# Patient Record
Sex: Female | Born: 1935 | Race: White | Hispanic: No | Smoking: Never smoker
Health system: Southern US, Community
[De-identification: ages and names within clinical notes are randomized; demographics above are authoritative.]

## PROBLEM LIST (undated history)

## (undated) DIAGNOSIS — F32A Depression, unspecified: Secondary | ICD-10-CM

## (undated) DIAGNOSIS — C189 Malignant neoplasm of colon, unspecified: Secondary | ICD-10-CM

## (undated) DIAGNOSIS — I471 Supraventricular tachycardia, unspecified: Secondary | ICD-10-CM

## (undated) DIAGNOSIS — R112 Nausea with vomiting, unspecified: Secondary | ICD-10-CM

## (undated) DIAGNOSIS — M1990 Unspecified osteoarthritis, unspecified site: Secondary | ICD-10-CM

## (undated) DIAGNOSIS — H1851 Endothelial corneal dystrophy: Secondary | ICD-10-CM

## (undated) DIAGNOSIS — M8580 Other specified disorders of bone density and structure, unspecified site: Secondary | ICD-10-CM

## (undated) DIAGNOSIS — F329 Major depressive disorder, single episode, unspecified: Secondary | ICD-10-CM

## (undated) DIAGNOSIS — K4090 Unilateral inguinal hernia, without obstruction or gangrene, not specified as recurrent: Secondary | ICD-10-CM

## (undated) DIAGNOSIS — L509 Urticaria, unspecified: Secondary | ICD-10-CM

## (undated) DIAGNOSIS — Z9889 Other specified postprocedural states: Secondary | ICD-10-CM

## (undated) DIAGNOSIS — I480 Paroxysmal atrial fibrillation: Secondary | ICD-10-CM

## (undated) DIAGNOSIS — D693 Immune thrombocytopenic purpura: Secondary | ICD-10-CM

## (undated) HISTORY — PX: ROTATOR CUFF REPAIR: SHX139

## (undated) HISTORY — DX: Other specified disorders of bone density and structure, unspecified site: M85.80

## (undated) HISTORY — PX: COLON SURGERY: SHX602

## (undated) HISTORY — DX: Endothelial corneal dystrophy: H18.51

## (undated) HISTORY — DX: Supraventricular tachycardia: I47.1

## (undated) HISTORY — DX: Major depressive disorder, single episode, unspecified: F32.9

## (undated) HISTORY — DX: Supraventricular tachycardia, unspecified: I47.10

## (undated) HISTORY — PX: EYE SURGERY: SHX253

## (undated) HISTORY — PX: SMALL INTESTINE SURGERY: SHX150

## (undated) HISTORY — DX: Urticaria, unspecified: L50.9

## (undated) HISTORY — PX: ABDOMINAL HYSTERECTOMY: SHX81

## (undated) HISTORY — DX: Malignant neoplasm of colon, unspecified: C18.9

## (undated) HISTORY — DX: Immune thrombocytopenic purpura: D69.3

## (undated) HISTORY — DX: Unspecified osteoarthritis, unspecified site: M19.90

## (undated) HISTORY — DX: Depression, unspecified: F32.A

## (undated) HISTORY — DX: Paroxysmal atrial fibrillation: I48.0

---

## 1997-04-08 HISTORY — PX: SPLENECTOMY: SUR1306

## 1997-08-26 ENCOUNTER — Inpatient Hospital Stay (HOSPITAL_COMMUNITY): Admission: AD | Admit: 1997-08-26 | Discharge: 1997-09-19 | Payer: Self-pay | Admitting: Oncology

## 1998-01-19 ENCOUNTER — Ambulatory Visit (HOSPITAL_COMMUNITY): Admission: RE | Admit: 1998-01-19 | Discharge: 1998-01-19 | Payer: Self-pay | Admitting: Oncology

## 1998-04-17 ENCOUNTER — Encounter: Payer: Self-pay | Admitting: Orthopedic Surgery

## 1998-04-17 ENCOUNTER — Inpatient Hospital Stay (HOSPITAL_COMMUNITY): Admission: RE | Admit: 1998-04-17 | Discharge: 1998-04-19 | Payer: Self-pay | Admitting: Orthopedic Surgery

## 1998-05-01 ENCOUNTER — Encounter: Admission: RE | Admit: 1998-05-01 | Discharge: 1998-07-30 | Payer: Self-pay | Admitting: Orthopedic Surgery

## 2000-04-08 DIAGNOSIS — H18519 Endothelial corneal dystrophy, unspecified eye: Secondary | ICD-10-CM

## 2000-04-08 DIAGNOSIS — C189 Malignant neoplasm of colon, unspecified: Secondary | ICD-10-CM

## 2000-04-08 HISTORY — DX: Malignant neoplasm of colon, unspecified: C18.9

## 2000-04-08 HISTORY — DX: Endothelial corneal dystrophy, unspecified eye: H18.519

## 2000-11-10 ENCOUNTER — Encounter: Payer: Self-pay | Admitting: *Deleted

## 2000-11-10 ENCOUNTER — Encounter: Admission: RE | Admit: 2000-11-10 | Discharge: 2000-11-10 | Payer: Self-pay | Admitting: *Deleted

## 2000-12-03 ENCOUNTER — Ambulatory Visit (HOSPITAL_COMMUNITY): Admission: RE | Admit: 2000-12-03 | Discharge: 2000-12-03 | Payer: Self-pay | Admitting: Gastroenterology

## 2000-12-04 ENCOUNTER — Encounter: Payer: Self-pay | Admitting: Gastroenterology

## 2000-12-04 ENCOUNTER — Ambulatory Visit (HOSPITAL_COMMUNITY): Admission: RE | Admit: 2000-12-04 | Discharge: 2000-12-04 | Payer: Self-pay | Admitting: Gastroenterology

## 2000-12-22 ENCOUNTER — Encounter: Admission: RE | Admit: 2000-12-22 | Discharge: 2001-01-05 | Payer: Self-pay | Admitting: Surgery

## 2000-12-22 ENCOUNTER — Encounter: Payer: Self-pay | Admitting: Surgery

## 2000-12-29 ENCOUNTER — Inpatient Hospital Stay (HOSPITAL_COMMUNITY): Admission: RE | Admit: 2000-12-29 | Discharge: 2001-01-05 | Payer: Self-pay | Admitting: Surgery

## 2000-12-29 ENCOUNTER — Encounter (INDEPENDENT_AMBULATORY_CARE_PROVIDER_SITE_OTHER): Payer: Self-pay | Admitting: *Deleted

## 2001-01-14 ENCOUNTER — Encounter: Payer: Self-pay | Admitting: Surgery

## 2001-01-14 ENCOUNTER — Encounter: Admission: RE | Admit: 2001-01-14 | Discharge: 2001-01-14 | Payer: Self-pay | Admitting: Surgery

## 2001-02-02 ENCOUNTER — Encounter: Payer: Self-pay | Admitting: Surgery

## 2001-02-02 ENCOUNTER — Ambulatory Visit (HOSPITAL_COMMUNITY): Admission: RE | Admit: 2001-02-02 | Discharge: 2001-02-02 | Payer: Self-pay | Admitting: Surgery

## 2001-07-31 ENCOUNTER — Ambulatory Visit (HOSPITAL_BASED_OUTPATIENT_CLINIC_OR_DEPARTMENT_OTHER): Admission: RE | Admit: 2001-07-31 | Discharge: 2001-07-31 | Payer: Self-pay | Admitting: Surgery

## 2001-10-12 ENCOUNTER — Other Ambulatory Visit: Admission: RE | Admit: 2001-10-12 | Discharge: 2001-10-12 | Payer: Self-pay | Admitting: Geriatric Medicine

## 2002-01-06 ENCOUNTER — Ambulatory Visit (HOSPITAL_COMMUNITY): Admission: RE | Admit: 2002-01-06 | Discharge: 2002-01-06 | Payer: Self-pay | Admitting: Gastroenterology

## 2003-02-24 ENCOUNTER — Emergency Department (HOSPITAL_COMMUNITY): Admission: EM | Admit: 2003-02-24 | Discharge: 2003-02-24 | Payer: Self-pay | Admitting: *Deleted

## 2003-02-25 ENCOUNTER — Emergency Department (HOSPITAL_COMMUNITY): Admission: EM | Admit: 2003-02-25 | Discharge: 2003-02-25 | Payer: Self-pay

## 2003-11-16 ENCOUNTER — Encounter: Admission: RE | Admit: 2003-11-16 | Discharge: 2003-11-16 | Payer: Self-pay | Admitting: Geriatric Medicine

## 2004-05-24 ENCOUNTER — Ambulatory Visit: Payer: Self-pay | Admitting: Oncology

## 2005-05-21 ENCOUNTER — Ambulatory Visit: Payer: Self-pay | Admitting: Oncology

## 2005-08-07 ENCOUNTER — Encounter: Payer: Self-pay | Admitting: Oncology

## 2006-05-19 ENCOUNTER — Ambulatory Visit: Payer: Self-pay | Admitting: Oncology

## 2006-05-21 LAB — CBC WITH DIFFERENTIAL/PLATELET
Basophils Absolute: 0 10*3/uL (ref 0.0–0.1)
EOS%: 1.3 % (ref 0.0–7.0)
Eosinophils Absolute: 0.1 10*3/uL (ref 0.0–0.5)
HGB: 14 g/dL (ref 11.6–15.9)
MONO#: 0.5 10*3/uL (ref 0.1–0.9)
NEUT#: 3.8 10*3/uL (ref 1.5–6.5)
RDW: 13.2 % (ref 11.3–14.5)
WBC: 6.5 10*3/uL (ref 3.9–10.0)
lymph#: 2.1 10*3/uL (ref 0.9–3.3)

## 2006-05-21 LAB — COMPREHENSIVE METABOLIC PANEL
AST: 26 U/L (ref 0–37)
Albumin: 4.3 g/dL (ref 3.5–5.2)
BUN: 12 mg/dL (ref 6–23)
Calcium: 9.4 mg/dL (ref 8.4–10.5)
Chloride: 105 mEq/L (ref 96–112)
Glucose, Bld: 117 mg/dL — ABNORMAL HIGH (ref 70–99)
Potassium: 4.3 mEq/L (ref 3.5–5.3)

## 2007-05-27 ENCOUNTER — Ambulatory Visit: Payer: Self-pay | Admitting: Oncology

## 2010-08-24 NOTE — Consult Note (Signed)
[facility]  Patient:    Gabriella Jackson, Gabriella Jackson Visit Number: [id number] MRN: [id number]          Service Type: SUR Location: 4W [id number] 01 Attending Physician:  Andre Lefort Dictated by:   Genene Churn. Cyndie Chime, M.D. Proc. Date: 01/02/01 Admit Date:  12/29/2000   CC:         Sandria Bales. Ezzard Standing, M.D.  Hal T. Stoneking, M.D.  Charolett Bumpers III, M.D.   Consultation Report  HISTORY OF PRESENT ILLNESS:  This is a medical oncology consultation to evaluate this woman for recently diagnosed colon cancer.  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  Her disease was refractory to all medical treatment.  She came to a splenectomy in June 1999.  She had a slow but eventual complete response.  Last platelet count done in my office in July 2001, was 250,000.  She presented to her family physician with a three- to four-week history of vague right lower quadrant abdominal pain without any change in bowel habit, melena, or hematochezia.  She has had vague pains in the same area for a long time, but they always went away.  The difference this time was that the pain was constant.  A preliminary evaluation revealed a normal physical examination, a normal laboratory profile with hemoglobin of 14.3, normal liver chemistries, and a normal CEA of 2.8.  A CT scan of the abdomen and pelvis done on November 10, 2000, suggested a mass in the ascending colon.  This was confirmed by a colonoscopy done on December 04, 2000, which showed a large lobulated mass in the ascending colon extending to the cecal area.  She was admitted here on December 29, 2000, and underwent a right hemicolectomy by Dr. Ovidio Kin.  Liver was normal to inspection and palpation.  A large mass was palpable in the ascending colon.  There was no obvious lymphadenopathy. Pathology showed a 5 cm moderately differentiated adenocarcinoma  with penetration through the bowel wall into the pericolonic adipose tissue. Eighteen lymph nodes were negative for any tumor (T3, N0, M0).  She is having an uneventful recovery from surgery.  PHYSICAL EXAMINATION:  VITAL SIGNS:  Weight is 126 pounds, height 5 feet 4 inches.  SKIN, HAIR, NAILS:  Normal. HEENT:  Normal.  NECK:  Supple.  No thyromegaly.  LUNGS:  Clear.  Resonant to percussion.  CARDIOVASCULAR:  Regular cardiac rhythm.  No murmurs.  BREASTS:  No masses.  No lymphadenopathy.  ABDOMEN:  Soft.  Midline incision healing nicely.  No palpable hepatomegaly.  EXTREMITIES:  No edema.  No calf tenderness.  PERTINENT PREOPERATIVE LABORATORY DATA:  Hemoglobin 14.4, hematocrit 41.7, MCV 93, white count 7700, neutrophils 63%, platelets 329,000.  BUN 13, creatinine 0.7, SGOT 37, SGPT 21, alkaline phosphatase 86, bilirubin 0.5, LDH 164.  CEA 2.8.  IMPRESSION:  [age]-year-old woman with history of ITP in complete remission postsplenectomy, now presents with vague right lower quadrant abdominal pain and is found to have a lesion in the ascending colon. Pathology shows that this is a stage II, T3, N0, M0 lesion.  She would benefit from adjuvant chemotherapy.  This would increase her cure rate from approximately 70% to 85%.  I will offer her a standard program of 5-fluorouracil plus leucovorin after complete recovery from surgery.  She will consider her options, and I will arrange outpatient follow-up for a more detailed discussion with the patient and her family.  Thank you for this consultation. Dictated by:   Genene Churn. Cyndie Chime, M.D.  Attending Physician:  Andre Lefort DD:  01/02/01 TD:  01/03/01 Job: [id number]

## 2010-08-24 NOTE — Op Note (Signed)
   NAME:  Gabriella Jackson, Gabriella Jackson NO.:  [id number]   MEDICAL RECORD NO.:  [id number]                   PATIENT TYPE:  AMB   LOCATION:  ENDO                                 FACILITY:  [facility]   PHYSICIAN:  Danise Edge, M.D.                DATE OF BIRTH:  01-25-1925   DATE OF PROCEDURE:  01/06/2002  DATE OF DISCHARGE:                                 OPERATIVE REPORT   PROCEDURE:  Screening colonoscopy.   REFERRING PHYSICIAN:  Hal T. Pete Glatter, M.D.; Sandria Bales. Ezzard Standing, MD; Genene Churn.  Cyndie Chime, M.D.   PROCEDURE INDICATION:  The patient is a 77 year old female born September 23, 1924.  Approximately one year ago she was diagnosed with adenocarcinoma of  the ascending colon.  She underwent  surgery to remove the cancer and had postop chemotherapy.  She is scheduled  to undergo her first postoperative colorectal neoplasia screen.   ENDOSCOPIST:  Danise Edge, M.D.   PREMEDICATION:  Versed 5 mg, Demerol 50 mg.   ENDOSCOPE:  Olympus pediatric colonoscope.   PROCEDURE:  After obtaining informed consent, the patient was placed in the  left lateral decubitus position.  I administered intravenous Demerol and  intravenous Versed to achieve conscious sedation for the procedure.  The  patient's blood pressure, oxygen saturation, and cardiac rhythm were  monitored throughout the procedure and documented in the medical record.   Anal inspection was normal.  Digital rectal examination was normal.  The  Olympus pediatric video colonoscope was introduced into the rectum and  easily advanced to the ileo right colonic surgical anastomosis.  Colonic  preparation for the exam today was excellent.   Rectum:  Normal.   Sigmoid colon and descending colon:  Left colonic diverticulosis.   Splenic flexure:  Normal.   Transverse colon:  Normal.   Ileo right colonic surgical anastomosis:  Normal.   ASSESSMENT:  Normal screening proctocolonoscopy to the ileo right colonic  surgical anastomosis.  No endoscopic for the presence of recurrent  colorectal neoplasia.   RECOMMENDATIONS:  Repeat colonoscopy in three years.                                                   Danise Edge, M.D.    MJ/MEDQ  D:  01/06/2002  T:  01/06/2002  Job:  [id number]   cc:   Sandria Bales. Ezzard Standing, MD  [address]  [place]  [place] [address]  Fax: [phone]   Hal T. Stoneking, M.D.  [address]  [place], [place] [address]  Fax: [phone]   Genene Churn. Cyndie Chime, M.D.  [address] [facility]  [place]  [place] [address]  Fax: [phone]

## 2010-08-24 NOTE — Discharge Summary (Signed)
[facility]  Patient:    Gabriella Jackson, Gabriella Jackson Visit Number: [id number] MRN: [id number]          Service Type: DSU Location: DAY Attending Physician:  Andre Lefort Dictated by:   Sandria Bales. Ezzard Standing, M.D. Admit Date:  02/02/2001 Disc. Date: 01/05/01   CC:         Hal T. Stoneking, M.D.             Genene Churn. Cyndie Chime, M.D.                           Discharge Summary  DATE OF BIRTH:  Apr 01, 1925  CCS #[id number]  DISCHARGE DIAGNOSES: 1. Adenocarcinoma of the right colon extending through the muscularis propria    into pericolonic connective tissue with 0/18 nodes involved (T3, N0, M0    carcinoma). 2. Status post laparoscopic splenectomy for idiopathic thrombocytopenic    purpura in June 1999. 3. Rash, right upper arm, etiology undetermined. 4. Persistent nausea at the time of discharge. 5. On Adolor study.  PROCEDURE:  Right hemicolectomy on December 29, 2000.  HISTORY OF PRESENT ILLNESS:  Gabriella Jackson is a 77 year old, white female who is a patient of Dr. Alice Rieger who developed some right lower quadrant abdominal pain.  Dr. Pete Glatter obtained the CT of her abdomen which revealed a circumferential soft tissue thickening of the ascending colon.  Dr. Reece Agar did colonoscopy, but was only able to get to the splenic flexure.  A barium enema then revealed a large lobulated mass in the median wall of the ascending colon consistent with possible primary neoplasm of the colon.  The patient came to the hospital for resection of this right colon.  PAST MEDICAL HISTORY:  Laparoscopic splenectomy by Dr. Ovidio Kin in June 1999, for ITP and has responded well to that with normal platelet counts.  Dr. Cyndie Chime was managing hematology at the time.  ALLERGIES:  CODEINE and SULFA.  MEDICATION:  Premarin, which she had recently stopped on her own.  REVIEW OF SYSTEMS:  Otherwise unremarkable.  PHYSICAL EXAMINATION:  VITAL SIGNS:   Weight 125 pounds.  GENERAL:  She is a well-nourished, pleasant, older female, alert and cooperative on physical exam.  ABDOMEN:  Unremarkable, although I thought I could feel a mass in the right lower quadrant which may be 3-4 cm in size and is probably because of her slight size.  HOSPITAL COURSE:  She completed an antibiotic and mechanical bowel prep at home and presented to the hospital on December 29, 2000.  She then underwent a right hemicolectomy.  Also labeled a proximal, right colonic mesenteric node in a separate specimen.  Postoperatively, she did well.  She was placed on her Adolor study for bowel motility.  Her preop CEA was 2.8 with a normal range of 0 to 5.0.  Her postop hemoglobin was 1.18, hematocrit 34.5 and white count 10,200.  She tolerated small liquids, but had some persistent nausea throughout her entire hospital course.  We tried Phenergan, Zofran, Reglan all with minimal improvement in her symptoms.  She also developed an isolated rash of her right upper arm.  Etiology was unclear, but this resolved spontaneously before her discharge.  By September 30, which was postop day #7, she was at least keeping liquids down, still had some heartburn, mild diarrhea, but appeared to be ready for discharge.  She still complained of this mild, sort of persistent nausea.  I did give her  some Phenergan to go home.  ACTIVITY:  No heavy lifting for at least four weeks.  FOLLOWUP:  She will see me back in 10-14 days and call earlier with any persistent problems.  CONDITION ON DISCHARGE:  Good.  DISCHARGE LABORATORY DATA AND X-RAY FINDINGS:  Her final pathology showed a colonic adenocarcinoma extending through the muscularis into the pericolonic connective tissue, a T3, N0 tumor.  She had a total of 18 lymph nodes and all were negative. Dictated by:   Sandria Bales. Ezzard Standing, M.D. Attending Physician:  Andre Lefort DD:  02/02/01 TD:  02/03/01 Job: [id number]

## 2010-08-24 NOTE — Procedures (Signed)
[facility]  Patient:    Gabriella Jackson, Gabriella Jackson Visit Number: [id number] MRN: [id number]          Service Type: Attending:  Verlin Grills, M.D. Proc. Date: 12/03/00   CC:         Hal T. Stoneking, M.D.   Procedure Report  PROCEDURE:  Colonoscopy to the hepatic flexure.  REFERRING PHYSICIAN:  Hal T. Stoneking, M.D.  PROCEDURE INDICATION:  Ms. Gabriella Jackson (date of birth 12/18/2024) is a 77 year old female with right lower quadrant abdominal pain and a palpable mass in the right lower quadrant of her abdomen.  A CT scan of the abdomen and pelvis revealed abnormal ascending colon wall thickening, rule out colonic adenocarcinoma.  I discussed with Gabriella Jackson the complications associated with colonoscopy and polypectomy including intestinal bleeding and intestinal perforation. Gabriella Jackson has signed the operative permit.  ENDOSCOPIST:  Verlin Grills, M.D.  PREMEDICATION:  Versed 6 mg, Demerol 60 mg.  ENDOSCOPE:  Olympus pediatric colonoscope.  DESCRIPTION OF PROCEDURE:  After obtaining informed consent, Gabriella Jackson was placed in the left lateral decubitus position.  I administered intravenous Versed and intravenous Demerol to achieve conscious sedation for the procedure.  The patients blood pressure, oxygen saturations, and cardiac rhythm were monitored throughout the procedure and documented in the medical record.  Anal inspection was normal.  Digital rectal exam was normal.  The Olympus pediatric video colonoscope was introduced into the rectum and advanced to the hepatic flexure.  Due to left colon loop formation, I was unable to examine the right colon.  Colonic preparation for the exam today was excellent.  RECTUM:  Normal.  SIGMOID COLON AND DESCENDING COLON:  Normal.  SPLENIC FLEXURE:  Normal.  TRANSVERSE COLON:  Normal.  HEPATIC FLEXURE:  Normal.  Ascending colon, cecum, and ileocecal valve were not  examined.  ASSESSMENT:  Normal proctocolonoscopy to the hepatic flexure.  Due to colonic loop formation, I was unable to examine the ascending colon, cecum, or ileocecal valve.  RECOMMENDATIONS:  I will schedule Gabriella Jackson for an air contrast barium enema at [facility], December 04, 2000. Attending:  Verlin Grills, M.D. DD:  12/03/00 TD:  12/03/00 Job: [id number]

## 2011-04-17 DIAGNOSIS — H18519 Endothelial corneal dystrophy, unspecified eye: Secondary | ICD-10-CM | POA: Insufficient documentation

## 2012-02-05 DIAGNOSIS — Z961 Presence of intraocular lens: Secondary | ICD-10-CM | POA: Insufficient documentation

## 2012-05-13 ENCOUNTER — Telehealth: Payer: Self-pay | Admitting: Radiology

## 2012-05-13 ENCOUNTER — Ambulatory Visit (INDEPENDENT_AMBULATORY_CARE_PROVIDER_SITE_OTHER): Payer: Medicare Other | Admitting: Emergency Medicine

## 2012-05-13 ENCOUNTER — Ambulatory Visit: Payer: Medicare Other

## 2012-05-13 VITALS — BP 133/65 | HR 108 | Temp 98.0°F | Resp 18 | Ht 64.0 in | Wt 124.0 lb

## 2012-05-13 DIAGNOSIS — S0003XA Contusion of scalp, initial encounter: Secondary | ICD-10-CM

## 2012-05-13 DIAGNOSIS — S82009A Unspecified fracture of unspecified patella, initial encounter for closed fracture: Secondary | ICD-10-CM

## 2012-05-13 DIAGNOSIS — S8000XA Contusion of unspecified knee, initial encounter: Secondary | ICD-10-CM

## 2012-05-13 DIAGNOSIS — S0083XA Contusion of other part of head, initial encounter: Secondary | ICD-10-CM

## 2012-05-13 DIAGNOSIS — S1093XA Contusion of unspecified part of neck, initial encounter: Secondary | ICD-10-CM

## 2012-05-13 NOTE — Patient Instructions (Signed)
Knee Pain The knee is the complex joint between your thigh and your lower leg. It is made up of bones, tendons, ligaments, and cartilage. The bones that make up the knee are:  The femur in the thigh.  The tibia and fibula in the lower leg.  The patella or kneecap riding in the groove on the lower femur. CAUSES  Knee pain is a common complaint with many causes. A few of these causes are:  Injury, such as:  A ruptured ligament or tendon injury.  Torn cartilage.  Medical conditions, such as:  Gout  Arthritis  Infections  Overuse, over training or overdoing a physical activity. Knee pain can be minor or severe. Knee pain can accompany debilitating injury. Minor knee problems often respond well to self-care measures or get well on their own. More serious injuries may need medical intervention or even surgery. SYMPTOMS The knee is complex. Symptoms of knee problems can vary widely. Some of the problems are:  Pain with movement and weight bearing.  Swelling and tenderness.  Buckling of the knee.  Inability to straighten or extend your knee.  Your knee locks and you cannot straighten it.  Warmth and redness with pain and fever.  Deformity or dislocation of the kneecap. DIAGNOSIS  Determining what is wrong may be very straight forward such as when there is an injury. It can also be challenging because of the complexity of the knee. Tests to make a diagnosis may include:  Your caregiver taking a history and doing a physical exam.  Routine X-rays can be used to rule out other problems. X-rays will not reveal a cartilage tear. Some injuries of the knee can be diagnosed by:  Arthroscopy a surgical technique by which a small video camera is inserted through tiny incisions on the sides of the knee. This procedure is used to examine and repair internal knee joint problems. Tiny instruments can be used during arthroscopy to repair the torn knee cartilage (meniscus).  Arthrography  is a radiology technique. A contrast liquid is directly injected into the knee joint. Internal structures of the knee joint then become visible on X-ray film.  An MRI scan is a non x-ray radiology procedure in which magnetic fields and a computer produce two- or three-dimensional images of the inside of the knee. Cartilage tears are often visible using an MRI scanner. MRI scans have largely replaced arthrography in diagnosing cartilage tears of the knee.  Blood work.  Examination of the fluid that helps to lubricate the knee joint (synovial fluid). This is done by taking a sample out using a needle and a syringe. TREATMENT The treatment of knee problems depends on the cause. Some of these treatments are:  Depending on the injury, proper casting, splinting, surgery or physical therapy care will be needed.  Give yourself adequate recovery time. Do not overuse your joints. If you begin to get sore during workout routines, back off. Slow down or do fewer repetitions.  For repetitive activities such as cycling or running, maintain your strength and nutrition.  Alternate muscle groups. For example if you are a weight lifter, work the upper body on one day and the lower body the next.  Either tight or weak muscles do not give the proper support for your knee. Tight or weak muscles do not absorb the stress placed on the knee joint. Keep the muscles surrounding the knee strong.  Take care of mechanical problems.  If you have flat feet, orthotics or special shoes may help.   See your caregiver if you need help.  Arch supports, sometimes with wedges on the inner or outer aspect of the heel, can help. These can shift pressure away from the side of the knee most bothered by osteoarthritis.  A brace called an "unloader" brace also may be used to help ease the pressure on the most arthritic side of the knee.  If your caregiver has prescribed crutches, braces, wraps or ice, use as directed. The acronym for  this is PRICE. This means protection, rest, ice, compression and elevation.  Nonsteroidal anti-inflammatory drugs (NSAID's), can help relieve pain. But if taken immediately after an injury, they may actually increase swelling. Take NSAID's with food in your stomach. Stop them if you develop stomach problems. Do not take these if you have a history of ulcers, stomach pain or bleeding from the bowel. Do not take without your caregiver's approval if you have problems with fluid retention, heart failure, or kidney problems.  For ongoing knee problems, physical therapy may be helpful.  Glucosamine and chondroitin are over-the-counter dietary supplements. Both may help relieve the pain of osteoarthritis in the knee. These medicines are different from the usual anti-inflammatory drugs. Glucosamine may decrease the rate of cartilage destruction.  Injections of a corticosteroid drug into your knee joint may help reduce the symptoms of an arthritis flare-up. They may provide pain relief that lasts a few months. You may have to wait a few months between injections. The injections do have a small increased risk of infection, water retention and elevated blood sugar levels.  Hyaluronic acid injected into damaged joints may ease pain and provide lubrication. These injections may work by reducing inflammation. A series of shots may give relief for as long as 6 months.  Topical painkillers. Applying certain ointments to your skin may help relieve the pain and stiffness of osteoarthritis. Ask your pharmacist for suggestions. Many over the-counter products are approved for temporary relief of arthritis pain.  In some countries, doctors often prescribe topical NSAID's for relief of chronic conditions such as arthritis and tendinitis. A review of treatment with NSAID creams found that they worked as well as oral medications but without the serious side effects. PREVENTION  Maintain a healthy weight. Extra pounds put  more strain on your joints.  Get strong, stay limber. Weak muscles are a common cause of knee injuries. Stretching is important. Include flexibility exercises in your workouts.  Be smart about exercise. If you have osteoarthritis, chronic knee pain or recurring injuries, you may need to change the way you exercise. This does not mean you have to stop being active. If your knees ache after jogging or playing basketball, consider switching to swimming, water aerobics or other low-impact activities, at least for a few days a week. Sometimes limiting high-impact activities will provide relief.  Make sure your shoes fit well. Choose footwear that is right for your sport.  Protect your knees. Use the proper gear for knee-sensitive activities. Use kneepads when playing volleyball or laying carpet. Buckle your seat belt every time you drive. Most shattered kneecaps occur in car accidents.  Rest when you are tired. SEEK MEDICAL CARE IF:  You have knee pain that is continual and does not seem to be getting better.  SEEK IMMEDIATE MEDICAL CARE IF:  Your knee joint feels hot to the touch and you have a high fever. MAKE SURE YOU:   Understand these instructions.  Will watch your condition.  Will get help right away if you are not   doing well or get worse. Document Released: 01/20/2007 Document Revised: 06/17/2011 Document Reviewed: 01/20/2007 Exit[employer] Patient Information 2013 [employer].  

## 2012-05-13 NOTE — Telephone Encounter (Signed)
Called patient to advise her patella has a small fracture. She is advised to limit flexion of this until she can see ortho, have put in referral for urgent Ortho. She has seen Dr Cleophas Dunker in the past. I called Del[facility] Harness and worked her in for an appt for tomorrow, Dr Cleophas Dunker can see her at 10 am.

## 2012-05-13 NOTE — Progress Notes (Signed)
[facility] [address], [place] [address] [phone]  Date:  05/13/2012   Name:  Gabriella Jackson   DOB:  Aug 28, 1924   MRN:  [id number]  PCP:  No primary provider on file.    Chief Complaint: Fall and Knee Pain   History of Present Illness:  Gabriella Jackson is a 79 y.o. very pleasant female patient who presents with the following:  This afternoon was going to the bank and tripped over the curb and fell, landing on her right knee and left cheek.  Broke her glasses. Definitely did not have LOC or dizziness but insists that she tripped and fell. No LOC, no other complaint of neuro or visual symptoms.   No neck or wrist pain.  Says that she cannot painlessly bear weight on her right knee.  There is no problem list on file for this patient.   Past Medical History  Diagnosis Date  . Cancer   . Cataract   . Anxiety   . Clotting disorder     Past Surgical History  Procedure Date  . Eye surgery   . Colon surgery   . Abdominal hysterectomy   . Small intestine surgery     History  Substance Use Topics  . Smoking status: Never Smoker   . Smokeless tobacco: Not on file  . Alcohol Use: 3.5 oz/week    7 drink(s) per week    History reviewed. No pertinent family history.  Allergies  Allergen Reactions  . Codeine Nausea Only  . Sulfa Antibiotics     Medication list has been reviewed and updated.  Current Outpatient Prescriptions on File Prior to Visit  Medication Sig Dispense Refill  . citalopram (CELEXA) 20 MG tablet Take 20 mg by mouth daily.      Marland Kitchen loratadine (CLARITIN) 10 MG tablet Take 10 mg by mouth daily.      . simvastatin (ZOCOR) 10 MG tablet Take 10 mg by mouth at bedtime.        Review of Systems:  As per HPI, otherwise negative.    Physical Examination: Filed Vitals:   05/13/12 1332  BP: 133/65  Pulse: 108  Temp: 98 F (36.7 C)  Resp: 18   Filed Vitals:   05/13/12 1332  Height: 5\' 4"  (1.626 m)  Weight: 124 lb  (56.246 kg)   Body mass index is 21.28 kg/(m^2). Ideal Body Weight: Weight in (lb) to have BMI = 25: 145.3   GEN: WDWN, NAD, Non-toxic, A & O x 3 HEENT: contusion left malar prominence, Normocephalic. Neck supple. No masses, No LAD.  No Battle sign Ears and Nose: No external deformity. CV: RRR, No M/G/R. No JVD. No thrill. No extra heart sounds. PULM: CTA B, no wheezes, crackles, rhonchi. No retractions. No resp. distress. No accessory muscle use. ABD: S, NT, ND, +BS. No rebound. No HSM. EXTR: No c/c/e.  Contusion right knee NEURO Normal gait.  PSYCH: Normally interactive. Conversant. Not depressed or anxious appearing.  Calm demeanor.    Assessment and Plan: Contusion knee, right Contusion cheek, left  Carmelina Dane, MD  [facility] reading (PRIMARY) by  Dr. Dareen Piano.  Right knee.  Negative other than DJD.  [facility] reading (PRIMARY) by  Dr. Dareen Piano.  Facial bones.  No fracture or effusion.

## 2013-01-27 ENCOUNTER — Ambulatory Visit (INDEPENDENT_AMBULATORY_CARE_PROVIDER_SITE_OTHER): Payer: Medicare Other | Admitting: Internal Medicine

## 2013-01-27 ENCOUNTER — Encounter: Payer: Self-pay | Admitting: Internal Medicine

## 2013-01-27 ENCOUNTER — Encounter: Payer: Self-pay | Admitting: Interventional Cardiology

## 2013-01-27 VITALS — BP 116/74 | HR 107 | Wt 118.6 lb

## 2013-01-27 DIAGNOSIS — I498 Other specified cardiac arrhythmias: Secondary | ICD-10-CM

## 2013-01-27 DIAGNOSIS — Z23 Encounter for immunization: Secondary | ICD-10-CM

## 2013-01-27 DIAGNOSIS — I471 Supraventricular tachycardia: Secondary | ICD-10-CM | POA: Insufficient documentation

## 2013-01-27 LAB — BASIC METABOLIC PANEL
Calcium: 9.3 mg/dL (ref 8.4–10.5)
GFR: 65.56 mL/min (ref >60.00)
Glucose, Bld: 100 mg/dL — ABNORMAL HIGH (ref 70–99)
Potassium: 4.2 mEq/L (ref 3.5–5.1)
Sodium: 139 mEq/L (ref 135–145)

## 2013-01-27 LAB — T4, FREE: Free T4: 1.12 ng/dL (ref 0.60–1.60)

## 2013-01-27 MED ORDER — FLECAINIDE ACETATE 50 MG PO TABS: 50.0000 mg | ORAL_TABLET | Freq: Two times a day (BID) | ORAL | Status: DC

## 2013-01-27 MED ORDER — DILTIAZEM HCL ER COATED BEADS 120 MG PO CP24: 120.0000 mg | ORAL_CAPSULE | Freq: Every day | ORAL | Status: DC

## 2013-01-27 NOTE — Patient Instructions (Signed)
Your physician recommends that you schedule a follow-up appointment with Dr Johney Frame in one week  Your physician has recommended you make the following change in your medication:  1) Start Diltiazem 120mg  daily 2) Start Flecainide 50mg  twice daily   Your physician has requested that you have an echocardiogram. Echocardiography is a painless test that uses sound waves to create images of your heart. It provides your doctor with information about the size and shape of your heart and how well your heart's chambers and valves are working. This procedure takes approximately one hour. There are no restrictions for this procedure.  Your physician recommends that you return for lab work in: BMP/TSH/T4

## 2013-01-27 NOTE — Progress Notes (Signed)
Primary Care Physician: Dr Pete Glatter Referring Physician: Dr Fanny Skates is a 79 y.o. female with a h/o newly discovered SVT who presents for EP consultation.  She reports having symptoms of tachypalpitations several years ago which she previously attributed to anxiety.  Over the past few months, she reports increasing palpitations.  These are abrupt in onset and offset and are associated with flushing and dizziness.  She reports having an episode of brief presyncope several years ago but not recently.  She reports that episodes seem worse with anxiety.  Episodes have increased in frequency and duration.  Most episodes typically last several seconds, however they are now more frequent.  She presented to Dr Lesia Sago office today and was found to have sustained SVT.   Today, she denies symptoms of chest pain, shortness of breath, orthopnea, PND, lower extremity edema, or neurologic sequela. The patient is tolerating medications without difficulties and is otherwise without complaint today.   Past Medical History  Diagnosis Date  . Colon cancer 2002    s/p colonic resection  . Fuchs' corneal dystrophy 2002  . ITP (idiopathic thrombocytopenic purpura)     s/p splenectomy  . SVT (supraventricular tachycardia)   . Urticaria   . Depressed   . DJD (degenerative joint disease)   . Osteopenia    Past Surgical History  Procedure Laterality Date  . Eye surgery    . Colon surgery    . Abdominal hysterectomy    . Small intestine surgery    . Splenectomy  1999    Current Outpatient Prescriptions  Medication Sig Dispense Refill  . citalopram (CELEXA) 20 MG tablet Take 20 mg by mouth daily.      Marland Kitchen glucosamine-chondroitin 500-400 MG tablet Take 1 tablet by mouth 3 (three) times daily.      Marland Kitchen loratadine (CLARITIN) 10 MG tablet Take 10 mg by mouth daily.      . Multiple Vitamins-Minerals (MULTIVITAMIN WITH MINERALS) tablet Take 1 tablet by mouth daily.      . simvastatin (ZOCOR)  10 MG tablet Take 10 mg by mouth at bedtime.       No current facility-administered medications for this visit.    Allergies  Allergen Reactions  . Codeine Nausea Only  . Sulfa Antibiotics     History   Social History  . Marital Status: Married    Spouse Name: N/A    Number of Children: N/A  . Years of Education: N/A   Occupational History  . Not on file.   Social History Main Topics  . Smoking status: Never Smoker   . Smokeless tobacco: Not on file  . Alcohol Use: 3.5 oz/week    7 drink(s) per week     Comment: small glass of wine each day  . Drug Use: No  . Sexual Activity: No   Other Topics Concern  . Not on file   Social History Narrative   Lives in [place] alone.   Retired Diplomatic Services operational officer          Family History  Problem Relation Age of Onset  . CAD      ROS- All systems are reviewed and negative except as per the HPI above  Physical Exam: Filed Vitals:   01/27/13 1434  BP: 116/74  Pulse: 107  Weight: 118 lb 9.6 oz (53.797 kg)    GEN- The patient is elderly but pleasant appearing, alert and oriented x 3 today.   Head- normocephalic, atraumatic Eyes-  Sclera clear, conjunctiva  pink Ears- hearing intact Oropharynx- clear Neck- supple, no JVP Lymph- no cervical lymphadenopathy Lungs- Clear to ausculation bilaterally, normal work of breathing Heart- Regular rate and rhythm, no murmurs, rubs or gallops, PMI not laterally displaced GI- soft, NT, ND, + BS Extremities- no clubbing, cyanosis, or edema MS- no significant deformity or atrophy Skin- no rash or lesion Psych- euthymic mood, full affect Neuro- strength and sensation are intact  EKG today reveals sinus rhythm with nonsustained atach ekg from Dr Lesia Sago office reveals sustained SVT at 147 bpm Records from Dr Lesia Sago office are reviewed  Assessment and Plan:  1. SVT The patient presents with symptomatic recurrent SVT.  The behavior in the office today on telemetry is suggestive of  an atrial tach though I cannot exclude a reentrant arrhythmia. I will start diltiazem 120mg  daily today and also flecainide 50mg  BID Echo, tfts, bmet are ordered Return in 1 week Consider gxt once rhythm is stable to evaluate arrhythmias/ ischemia while on flecainide

## 2013-01-29 ENCOUNTER — Encounter: Payer: Self-pay | Admitting: *Deleted

## 2013-02-01 ENCOUNTER — Ambulatory Visit (INDEPENDENT_AMBULATORY_CARE_PROVIDER_SITE_OTHER): Payer: Medicare Other | Admitting: Internal Medicine

## 2013-02-01 ENCOUNTER — Encounter: Payer: Self-pay | Admitting: Internal Medicine

## 2013-02-01 VITALS — BP 129/77 | HR 95 | Ht 63.0 in | Wt 120.8 lb

## 2013-02-01 DIAGNOSIS — I498 Other specified cardiac arrhythmias: Secondary | ICD-10-CM

## 2013-02-01 DIAGNOSIS — I471 Supraventricular tachycardia: Secondary | ICD-10-CM

## 2013-02-01 NOTE — Patient Instructions (Signed)
Your physician recommends that you schedule a follow-up appointment in: 6 weeks with Dr Johney Frame  Your physician has requested that you have a lexiscan myoview. For further information please visit [email or web address] Please follow instruction sheet, as given.

## 2013-02-01 NOTE — Progress Notes (Signed)
PCP:  Ginette Otto, MD  The patient presents today for routine electrophysiology followup.  Since last being seen in our clinic, the patient reports doing very well.  She has not had any further symptoms of SVT.  She does have occasional fatigue and SOB with exertion.  Today, she denies symptoms of palpitations, chest pain,  orthopnea, PND, lower extremity edema, dizziness, presyncope, syncope, or neurologic sequela.  The patient feels that she is tolerating medications without difficulties and is otherwise without complaint today.   Past Medical History  Diagnosis Date  . Colon cancer 2002    s/p colonic resection  . Fuchs' corneal dystrophy 2002  . ITP (idiopathic thrombocytopenic purpura)     s/p splenectomy  . SVT (supraventricular tachycardia)   . Urticaria   . Depressed   . DJD (degenerative joint disease)   . Osteopenia    Past Surgical History  Procedure Laterality Date  . Eye surgery    . Colon surgery    . Abdominal hysterectomy    . Small intestine surgery    . Splenectomy  1999    Current Outpatient Prescriptions  Medication Sig Dispense Refill  . citalopram (CELEXA) 20 MG tablet Take 20 mg by mouth daily.      Marland Kitchen diltiazem (CARDIZEM CD) 120 MG 24 hr capsule Take 1 capsule (120 mg total) by mouth daily.  90 capsule  3  . flecainide (TAMBOCOR) 50 MG tablet Take 1 tablet (50 mg total) by mouth 2 (two) times daily.  180 tablet  3  . glucosamine-chondroitin 500-400 MG tablet Take 1 tablet by mouth 3 (three) times daily.      Marland Kitchen loratadine (CLARITIN) 10 MG tablet Take 10 mg by mouth daily.      . Multiple Vitamins-Minerals (MULTIVITAMIN WITH MINERALS) tablet Take 1 tablet by mouth daily.       No current facility-administered medications for this visit.    Allergies  Allergen Reactions  . Codeine Nausea Only  . Sulfa Antibiotics     History   Social History  . Marital Status: Married    Spouse Name: N/A    Number of Children: N/A  . Years of  Education: N/A   Occupational History  . Not on file.   Social History Main Topics  . Smoking status: Never Smoker   . Smokeless tobacco: Not on file  . Alcohol Use: 3.5 oz/week    7 drink(s) per week     Comment: small glass of wine each day  . Drug Use: No  . Sexual Activity: No   Other Topics Concern  . Not on file   Social History Narrative   Lives in [place] alone.   Retired Diplomatic Services operational officer          Family History  Problem Relation Age of Onset  . CAD      Physical Exam: Filed Vitals:   02/01/13 1605  BP: 129/77  Pulse: 95  Height: 5\' 3"  (1.6 m)  Weight: 120 lb 12.8 oz (54.795 kg)    GEN- The patient is well appearing, alert and oriented x 3 today.   Head- normocephalic, atraumatic Eyes-  Sclera clear, conjunctiva pink Ears- hearing intact Oropharynx- clear Neck- supple, no JVP Lymph- no cervical lymphadenopathy Lungs- Clear to ausculation bilaterally, normal work of breathing Heart- Regular rate and rhythm, no murmurs, rubs or gallops, PMI not laterally displaced GI- soft, NT, ND, + BS Extremities- no clubbing, cyanosis, or edema  ekg today reveals sinus rhythm 90 bpm,  PR 224, Qtc 474, rsR'  Assessment and Plan:  1. SVT (likely atrial tachycardia) Improved with flecainide Continue current medicines Could increase diltiazem as needed however given 1st degree AV block I will try to avoid this for now  As she is on flecainide, I will proceed with lexiscan myoview (She does not feel that she can walk on a treadmill due to hip pain) Echo is also pending  I will see her again in 6 weeks

## 2013-02-11 ENCOUNTER — Ambulatory Visit (HOSPITAL_COMMUNITY): Payer: Medicare Other | Attending: Cardiology | Admitting: Radiology

## 2013-02-11 DIAGNOSIS — R000 Tachycardia, unspecified: Secondary | ICD-10-CM | POA: Insufficient documentation

## 2013-02-11 DIAGNOSIS — I471 Supraventricular tachycardia: Secondary | ICD-10-CM

## 2013-02-11 DIAGNOSIS — I079 Rheumatic tricuspid valve disease, unspecified: Secondary | ICD-10-CM | POA: Insufficient documentation

## 2013-02-11 DIAGNOSIS — R0602 Shortness of breath: Secondary | ICD-10-CM

## 2013-02-11 DIAGNOSIS — R0609 Other forms of dyspnea: Secondary | ICD-10-CM | POA: Insufficient documentation

## 2013-02-11 DIAGNOSIS — R0989 Other specified symptoms and signs involving the circulatory and respiratory systems: Secondary | ICD-10-CM | POA: Insufficient documentation

## 2013-02-11 NOTE — Progress Notes (Signed)
Echocardiogram performed.  

## 2013-02-22 ENCOUNTER — Encounter (HOSPITAL_COMMUNITY): Payer: Medicare Other

## 2013-02-25 ENCOUNTER — Ambulatory Visit (HOSPITAL_COMMUNITY): Payer: Medicare Other | Attending: Internal Medicine | Admitting: Radiology

## 2013-02-25 VITALS — BP 154/88 | HR 82 | Ht 63.0 in | Wt 120.0 lb

## 2013-02-25 DIAGNOSIS — Z8249 Family history of ischemic heart disease and other diseases of the circulatory system: Secondary | ICD-10-CM | POA: Insufficient documentation

## 2013-02-25 DIAGNOSIS — R5381 Other malaise: Secondary | ICD-10-CM | POA: Insufficient documentation

## 2013-02-25 DIAGNOSIS — I471 Supraventricular tachycardia: Secondary | ICD-10-CM

## 2013-02-25 DIAGNOSIS — I498 Other specified cardiac arrhythmias: Secondary | ICD-10-CM | POA: Insufficient documentation

## 2013-02-25 DIAGNOSIS — I440 Atrioventricular block, first degree: Secondary | ICD-10-CM | POA: Insufficient documentation

## 2013-02-25 DIAGNOSIS — R0602 Shortness of breath: Secondary | ICD-10-CM | POA: Insufficient documentation

## 2013-02-25 MED ORDER — REGADENOSON 0.4 MG/5ML IV SOLN
0.4000 mg | Freq: Once | INTRAVENOUS | Status: AC
  Administered 2013-02-25: 0.4 mg via INTRAVENOUS

## 2013-02-25 MED ORDER — TECHNETIUM TC 99M SESTAMIBI GENERIC - CARDIOLITE
33.0000 | Freq: Once | INTRAVENOUS | Status: AC | PRN
  Administered 2013-02-25: 33 via INTRAVENOUS

## 2013-02-25 MED ORDER — TECHNETIUM TC 99M SESTAMIBI GENERIC - CARDIOLITE
11.0000 | Freq: Once | INTRAVENOUS | Status: AC | PRN
  Administered 2013-02-25: 11 via INTRAVENOUS

## 2013-02-25 NOTE — Progress Notes (Signed)
[facility] SITE 3 NUCLEAR MED [address] [place], [place] [address] [phone]    Cardiology Nuclear Med Study  Gabriella Jackson is a 79 y.o. female     MRN : [id number]     DOB: 01-03-1925  Procedure Date: 02/25/2013  Nuclear Med Background Indication for Stress Test:  Evaluation for Ischemia History:  No known CAD, Echo 02/2013 EF 60%, 1st degree AV Block, SVT Cardiac Risk Factors: Family History - CAD  Symptoms:  Fatigue and SOB   Nuclear Pre-Procedure Caffeine/Decaff Intake:  None NPO After: 9:00pm   Lungs:  clear O2 Sat: 94% on room air. IV 0.9% NS with Angio Cath:  22g  IV Site: R Hand  IV Started by:  Cathlyn Parsons, RN  Chest Size (in):  34 Cup Size: B  Height: 5\' 3"  (1.6 m)  Weight:  120 lb (54.432 kg)  BMI:  Body mass index is 21.26 kg/(m^2). Tech Comments:  n/a    Nuclear Med Study 1 or 2 day study: 1 day  Stress Test Type:  Lexiscan  Reading MD: Charlton Haws, MD  Order Authorizing Provider:  Jarold Song  Resting Radionuclide: Technetium 41m Sestamibi  Resting Radionuclide Dose: 10.7 mCi   Stress Radionuclide:  Technetium 11m Sestamibi  Stress Radionuclide Dose: 32.8 mCi           Stress Protocol Rest HR: 82 Stress HR: 116  Rest BP: 154/88 Stress BP: 158/81  Exercise Time (min): n/a METS: n/a   Predicted Max HR: 134 bpm % Max HR: 86.57 bpm Rate Pressure Product: 21308   Dose of Adenosine (mg):  n/a Dose of Lexiscan: 0.4 mg  Dose of Atropine (mg): n/a Dose of Dobutamine: n/a mcg/kg/min (at max HR)  Stress Test Technologist: Nelson Chimes, BS-ES  Nuclear Technologist:  Domenic Polite, CNMT     Rest Procedure:  Myocardial perfusion imaging was performed at rest 45 minutes following the intravenous administration of Technetium 83m Sestamibi. Rest ECG: NSR - Normal EKG  Stress Procedure:  The patient received IV Lexiscan 0.4 mg over 15-seconds.  Technetium 21m Sestamibi injected at 30-seconds.  The patient marched in place for  two minutes due to treadmill not starting. Quantitative spect images were obtained after a 45 minute delay.  During the infusion of Lexiscan, patient stated she felt slight SOB.  This resolved in recovery.  Stress ECG: No significant change from baseline ECG  QPS Raw Data Images:  Normal; no motion artifact; normal heart/lung ratio. Stress Images:  Normal homogeneous uptake in all areas of the myocardium. Rest Images:  Normal homogeneous uptake in all areas of the myocardium. Subtraction (SDS):  Normal Transient Ischemic Dilatation (Normal <1.22):  0.99 Lung/Heart Ratio (Normal <0.45):  0.28  Quantitative Gated Spect Images QGS EDV:  92 ml QGS ESV:  47 ml  Impression Exercise Capacity:  Lexiscan with low level exercise. BP Response:  Normal blood pressure response. Clinical Symptoms:  No significant symptoms noted. ECG Impression:  No significant ST segment change suggestive of ischemia. Comparison with Prior Nuclear Study: No previous nuclear study performed  Overall Impression:  Low risk stress nuclear study Mild decrease in LV function on surface images Suggest echo or MRI correlation No ischemia or infarction.  LV Ejection Fraction: 49%.  LV Wall Motion:  SEE ABOVE  Charlton Haws

## 2013-03-08 ENCOUNTER — Telehealth: Payer: Self-pay | Admitting: Internal Medicine

## 2013-03-08 NOTE — Telephone Encounter (Signed)
Daughter aware of results of stress test

## 2013-03-08 NOTE — Telephone Encounter (Signed)
Follow Up   Daughter calling to retrieve results--- Pt can not hear to well// Please call.

## 2013-03-17 ENCOUNTER — Ambulatory Visit (INDEPENDENT_AMBULATORY_CARE_PROVIDER_SITE_OTHER): Payer: Medicare Other | Admitting: Internal Medicine

## 2013-03-17 ENCOUNTER — Encounter (INDEPENDENT_AMBULATORY_CARE_PROVIDER_SITE_OTHER): Payer: Self-pay

## 2013-03-17 ENCOUNTER — Encounter: Payer: Self-pay | Admitting: Internal Medicine

## 2013-03-17 VITALS — BP 143/83 | HR 85 | Ht 63.0 in | Wt 121.0 lb

## 2013-03-17 DIAGNOSIS — I471 Supraventricular tachycardia: Secondary | ICD-10-CM

## 2013-03-17 DIAGNOSIS — I498 Other specified cardiac arrhythmias: Secondary | ICD-10-CM

## 2013-03-17 NOTE — Progress Notes (Signed)
PCP:  Ginette Otto, MD  The patient presents today for routine electrophysiology followup.  Since last being seen in our clinic, the patient reports doing very well.  She has not had any further symptoms of SVT.  Today, she denies symptoms of palpitations, chest pain,  orthopnea, PND, lower extremity edema, dizziness, presyncope, syncope, or neurologic sequela.  The patient feels that she is tolerating medications without difficulties and is otherwise without complaint today.   Past Medical History  Diagnosis Date  . Colon cancer 2002    s/p colonic resection  . Fuchs' corneal dystrophy 2002  . ITP (idiopathic thrombocytopenic purpura)     s/p splenectomy  . SVT (supraventricular tachycardia)   . Urticaria   . Depressed   . DJD (degenerative joint disease)   . Osteopenia    Past Surgical History  Procedure Laterality Date  . Eye surgery    . Colon surgery    . Abdominal hysterectomy    . Small intestine surgery    . Splenectomy  1999    Current Outpatient Prescriptions  Medication Sig Dispense Refill  . citalopram (CELEXA) 20 MG tablet Take 20 mg by mouth daily.      Marland Kitchen diltiazem (CARDIZEM CD) 120 MG 24 hr capsule Take 1 capsule (120 mg total) by mouth daily.  90 capsule  3  . flecainide (TAMBOCOR) 50 MG tablet Take 1 tablet (50 mg total) by mouth 2 (two) times daily.  180 tablet  3  . glucosamine-chondroitin 500-400 MG tablet Take 2 tablets by mouth daily.       Marland Kitchen loratadine (CLARITIN) 10 MG tablet Take 10 mg by mouth daily.      . Multiple Vitamins-Minerals (MULTIVITAMIN WITH MINERALS) tablet Take 1 tablet by mouth daily.       No current facility-administered medications for this visit.    Allergies  Allergen Reactions  . Codeine Nausea Only  . Sulfa Antibiotics     History   Social History  . Marital Status: Married    Spouse Name: N/A    Number of Children: N/A  . Years of Education: N/A   Occupational History  . Not on file.   Social History  Main Topics  . Smoking status: Never Smoker   . Smokeless tobacco: Not on file  . Alcohol Use: 3.5 oz/week    7 drink(s) per week     Comment: small glass of wine each day  . Drug Use: No  . Sexual Activity: No   Other Topics Concern  . Not on file   Social History Narrative   Lives in [place] alone.   Retired Diplomatic Services operational officer          Family History  Problem Relation Age of Onset  . CAD      Physical Exam: Filed Vitals:   03/17/13 1421  BP: 143/83  Pulse: 85  Height: 5\' 3"  (1.6 m)  Weight: 121 lb (54.885 kg)    GEN- The patient is well appearing, alert and oriented x 3 today.   Head- normocephalic, atraumatic Eyes-  Sclera clear, conjunctiva pink Ears- hearing intact Oropharynx- clear Neck- supple, no JVP Lymph- no cervical lymphadenopathy Lungs- Clear to ausculation bilaterally, normal work of breathing Heart- Regular rate and rhythm, no murmurs, rubs or gallops, PMI not laterally displaced GI- soft, NT, ND, + BS Extremities- no clubbing, cyanosis, or edema  ekg today reveals sinus rhythm 85 bpm, PR 222, Qtc 459, rsR' Echo and stress test reviewed with patient today  Assessment and Plan:  1. SVT (likely atrial tachycardia) Controlled with flecainide Continue current medicines  Return in 6 months

## 2013-03-17 NOTE — Patient Instructions (Signed)
Your physician wants you to follow-up in: 6 months with Dr. Allred. You will receive a reminder letter in the mail two months in advance. If you don't receive a letter, please call our office to schedule the follow-up appointment.  

## 2013-09-15 ENCOUNTER — Encounter: Payer: Self-pay | Admitting: Internal Medicine

## 2013-09-15 ENCOUNTER — Ambulatory Visit (INDEPENDENT_AMBULATORY_CARE_PROVIDER_SITE_OTHER): Payer: Medicare HMO | Admitting: Internal Medicine

## 2013-09-15 VITALS — BP 131/76 | HR 83 | Ht 63.0 in | Wt 111.0 lb

## 2013-09-15 DIAGNOSIS — I498 Other specified cardiac arrhythmias: Secondary | ICD-10-CM

## 2013-09-15 DIAGNOSIS — I471 Supraventricular tachycardia: Secondary | ICD-10-CM

## 2013-09-15 NOTE — Patient Instructions (Signed)
Your physician wants you to follow-up in: 12 months with Dr Allred You will receive a reminder letter in the mail two months in advance. If you don't receive a letter, please call our office to schedule the follow-up appointment.  

## 2013-09-17 NOTE — Progress Notes (Signed)
PCP:  Mathews Argyle, MD  The patient presents today for routine electrophysiology followup.  Since last being seen in our clinic, the patient reports doing very well.  She has not had any further symptoms of SVT.  Today, she denies symptoms of palpitations, exertional chest pain, orthopnea, PND, lower extremity edema, dizziness, presyncope, syncope, or neurologic sequela.  She has rare fleeting chest pains at rest.  She declines stress testing.  The patient feels that she is tolerating medications without difficulties and is otherwise without complaint today.   Past Medical History  Diagnosis Date  . Colon cancer 2002    s/p colonic resection  . Fuchs' corneal dystrophy 2002  . ITP (idiopathic thrombocytopenic purpura)     s/p splenectomy  . SVT (supraventricular tachycardia)   . Urticaria   . Depressed   . DJD (degenerative joint disease)   . Osteopenia    Past Surgical History  Procedure Laterality Date  . Eye surgery    . Colon surgery    . Abdominal hysterectomy    . Small intestine surgery    . Splenectomy  1999    Current Outpatient Prescriptions  Medication Sig Dispense Refill  . citalopram (CELEXA) 20 MG tablet Take 20 mg by mouth daily.      Marland Kitchen diltiazem (CARDIZEM CD) 120 MG 24 hr capsule Take 1 capsule (120 mg total) by mouth daily.  90 capsule  3  . flecainide (TAMBOCOR) 50 MG tablet Take 1 tablet (50 mg total) by mouth 2 (two) times daily.  180 tablet  3  . glucosamine-chondroitin 500-400 MG tablet Take 2 tablets by mouth daily.       Marland Kitchen loratadine (CLARITIN) 10 MG tablet Take 10 mg by mouth daily.      . Multiple Vitamins-Minerals (MULTIVITAMIN WITH MINERALS) tablet Take 1 tablet by mouth daily.       No current facility-administered medications for this visit.    Allergies  Allergen Reactions  . Codeine Nausea Only  . Sulfa Antibiotics     History   Social History  . Marital Status: Married    Spouse Name: N/A    Number of Children: N/A  .  Years of Education: N/A   Occupational History  . Not on file.   Social History Main Topics  . Smoking status: Never Smoker   . Smokeless tobacco: Not on file  . Alcohol Use: 3.5 oz/week    7 drink(s) per week     Comment: small glass of wine each day  . Drug Use: No  . Sexual Activity: No   Other Topics Concern  . Not on file   Social History Narrative   Lives in [place] alone.   Retired Network engineer          Family History  Problem Relation Age of Onset  . CAD      Physical Exam: Filed Vitals:   09/15/13 1230  BP: 131/76  Pulse: 83  Height: 5\' 3"  (1.6 m)  Weight: 111 lb (50.349 kg)    GEN- The patient is well appearing, alert and oriented x 3 today.   Head- normocephalic, atraumatic Eyes-  Sclera clear, conjunctiva pink Ears- hearing intact Oropharynx- clear Neck- supple, no JVP Lymph- no cervical lymphadenopathy Lungs- Clear to ausculation bilaterally, normal work of breathing Heart- Regular rate and rhythm, no murmurs, rubs or gallops, PMI not laterally displaced GI- soft, NT, ND, + BS Extremities- no clubbing, cyanosis, or edema  ekg today reveals sinus rhythm 83 bpm,  PR 216, Qtc 460, rsR' Echo and stress test reviewed with patient today  Assessment and Plan:  1. SVT (likely atrial tachycardia) Controlled with flecainide Continue current medicines  Return in 12 months

## 2014-02-04 ENCOUNTER — Other Ambulatory Visit: Payer: Self-pay | Admitting: Internal Medicine

## 2014-02-25 ENCOUNTER — Ambulatory Visit (INDEPENDENT_AMBULATORY_CARE_PROVIDER_SITE_OTHER): Payer: Medicare HMO | Admitting: Family Medicine

## 2014-02-25 ENCOUNTER — Encounter: Payer: Self-pay | Admitting: Family Medicine

## 2014-02-25 ENCOUNTER — Ambulatory Visit (INDEPENDENT_AMBULATORY_CARE_PROVIDER_SITE_OTHER)
Admission: RE | Admit: 2014-02-25 | Discharge: 2014-02-25 | Disposition: A | Discharge status: Home / Self Care | Payer: Medicare HMO | Source: Ambulatory Visit | Attending: Family Medicine | Admitting: Family Medicine

## 2014-02-25 ENCOUNTER — Other Ambulatory Visit (INDEPENDENT_AMBULATORY_CARE_PROVIDER_SITE_OTHER): Payer: Medicare HMO

## 2014-02-25 VITALS — BP 128/72 | HR 95 | Wt 113.0 lb

## 2014-02-25 DIAGNOSIS — M1990 Unspecified osteoarthritis, unspecified site: Secondary | ICD-10-CM

## 2014-02-25 DIAGNOSIS — M1611 Unilateral primary osteoarthritis, right hip: Secondary | ICD-10-CM

## 2014-02-25 DIAGNOSIS — M25551 Pain in right hip: Secondary | ICD-10-CM

## 2014-02-25 NOTE — Assessment & Plan Note (Signed)
Patient does have right hip arthritis likely. I would like to get x-rays to further evaluate. I do think that there is a possibility that patient could respond to injection of the hip but I would like to try conservative therapy first. Depending on the amount of arthritis patient may also want to consider surgical replacement with how well patient is doing at this time. Patient has had difficulty doing formal physical therapy for other problems previously. We discussed over-the-counter medicines and dosing that I think will be beneficial. We also discussed icing regimen and given home exercise program. Patient was referred to formal physical therapy which I also think will be beneficial. Patient and will come back in 4 weeks for further evaluation. Continuing to have difficulty we may want to try an intra-articular injection. Patient was warned of potential side effects.

## 2014-02-25 NOTE — Progress Notes (Signed)
  [facility] [place], [place] [address] Phone: [phone] Subjective:    CC: right hip pain  [id number] Gabriella Jackson is a 80 y.o. female coming in with complaint of Right hip pain. Patient states that she's been diagnosed with severe arthritis of the right hip previously. Patient was told that she needed a surgical repair but decided not to have this done. Patient states that this was greater than 2 years ago.patient states since that she is slowly started having worsening pain in the groin that seems to radiate towards her buttocks. Denies any significant radiation down the leg but can feel that her leg fatigues with a lot of walking. Denies any fevers or chills but has been losing some weight. Patient describes the pain as a dull aching sensation with sharp pain sometimes. Denies any nighttime awakenings. Patient is wondering if there is anything else that can be done other than a hip replacement.patient rates the severity of pain a 7 out of 10.     Past medical history, social, surgical and family history all reviewed in electronic medical record.   Review of Systems: No headache, visual changes, nausea, vomiting, diarrhea, constipation, dizziness, abdominal pain, skin rash, fevers, chills, night sweats, weight loss, swollen lymph nodes, body aches, joint swelling, muscle aches, chest pain, shortness of breath, mood changes.   Objective Blood pressure 128/72, pulse 95, weight 113 lb (51.256 kg), SpO2 97 %.  General: No apparent distress alert and oriented x3 mood and affect normal, dressed appropriately.  HEENT: Pupils equal, extraocular movements intact  Respiratory: Patient's speak in full sentences and does not appear short of breath  Cardiovascular: No lower extremity edema, non tender, no erythema  Skin: Warm dry intact with no signs of infection or rash on extremities or on axial skeleton.  Abdomen: Soft nontender  Neuro: Cranial  nerves II through XII are intact, neurovascularly intact in all extremities with 2+ DTRs and 2+ pulses.  Lymph: No lymphadenopathy of posterior or anterior cervical chain or axillae bilaterally.  Gait normal with good balance and coordination.  MSK:  Non tender with full range of motion and good stability and symmetric strength and tone of shoulders, elbows, wrist,  knee and ankles bilaterally.  [id number] ROM IR: 19 Deg with moderate pain, ER: 45 Deg, Flexion: 120 Deg, Extension: 100 Deg, Abduction: 45 Deg, Adduction: 45 Deg Strength IR: 4/5, ER: 5/5, Flexion: 5/5, Extension: 5/5, Abduction: 3/5, Adduction: 5/5 Pelvic alignment unremarkable to inspection and palpation. Standing hip rotation and gait without trendelenburg sign / unsteadiness. Greater trochanter without tenderness to palpation. No tenderness over piriformis and greater trochanter. + FADIR  Mild pain over the right SI joint Contralateral hip is unremarkable. Patient's get up and go normal    Impression and Recommendations:     This case required medical decision making of moderate complexity.

## 2014-02-25 NOTE — Patient Instructions (Addendum)
Good to meet you Xrays today.  Ice 20 minutes 2 times daily. Usually after activity and before bed. Exercises 3 times a week.  Take tylenol 650 mg three times a day is the best evidence based medicine we have for arthritis.  Fish oil 2 grams daily.  Tumeric 500mg  daily.  Cortisone injections are an option if these interventions do not seem to make a difference or need more relief.  If cortisone injections do not help, there are different types of shots that may help but they take longer to take effect.  We can discuss this at follow up.  It's important that you continue to stay active. Controlling your weight is important.  Consider physical therapy to strengthen muscles around the joint that hurts to take pressure off of the joint itself. Water aerobics and cycling with low resistance are the best two types of exercise for arthritis. Come back and see me in 3 weeks.    Dr. Frederik Pear or Dr. Melrose Nakayama at [facility].

## 2014-03-10 ENCOUNTER — Ambulatory Visit: Payer: Medicare HMO | Attending: Family Medicine

## 2014-03-10 DIAGNOSIS — M2560 Stiffness of unspecified joint, not elsewhere classified: Secondary | ICD-10-CM | POA: Insufficient documentation

## 2014-03-10 DIAGNOSIS — M25551 Pain in right hip: Secondary | ICD-10-CM | POA: Diagnosis not present

## 2014-03-10 DIAGNOSIS — R5381 Other malaise: Secondary | ICD-10-CM | POA: Diagnosis not present

## 2014-03-16 ENCOUNTER — Ambulatory Visit: Payer: Medicare HMO | Admitting: Physical Therapy

## 2014-03-16 DIAGNOSIS — M25551 Pain in right hip: Secondary | ICD-10-CM | POA: Diagnosis not present

## 2014-03-18 ENCOUNTER — Other Ambulatory Visit (INDEPENDENT_AMBULATORY_CARE_PROVIDER_SITE_OTHER): Payer: Medicare HMO

## 2014-03-18 ENCOUNTER — Ambulatory Visit (INDEPENDENT_AMBULATORY_CARE_PROVIDER_SITE_OTHER): Payer: Medicare HMO | Admitting: Family Medicine

## 2014-03-18 ENCOUNTER — Encounter: Payer: Self-pay | Admitting: Family Medicine

## 2014-03-18 VITALS — BP 126/62 | HR 105 | Ht 63.0 in | Wt 114.0 lb

## 2014-03-18 DIAGNOSIS — M1611 Unilateral primary osteoarthritis, right hip: Secondary | ICD-10-CM

## 2014-03-18 DIAGNOSIS — M1990 Unspecified osteoarthritis, unspecified site: Secondary | ICD-10-CM

## 2014-03-18 NOTE — Assessment & Plan Note (Signed)
Patient was given an injection today and tolerated the procedure very well. We discussed icing regimen at home exercises. We discussed potential side effects to the hip injection into seek medical attention if any bleeding occurs or if any signs of infection. Patient will increase her activity as tolerated. We discussed again that surgical intervention likely will be patient's best option. Patient knows that this injection is only going to be temporary relief. Patient will try to remain active. Patient can follow-up with me again in 4 weeks to make sure she is improving.  Spent greater than 25 minutes with patient face-to-face and had greater than 50% of counseling including as described above in assessment and plan.

## 2014-03-18 NOTE — Progress Notes (Signed)
[facility] [address], [place] [address] Phone: [phone] Subjective:    CC: right hip pain follow up  [id number] Gabriella Gabriella DESROSIERS is a 80 y.o. female coming in with complaint of Right hip pain. Patient states that she's been diagnosed with severe arthritis of the right hip previously. Patient was told that she needed a surgical repair but decided not to have this done. I did see patient in patient is a moderate to severe osteophytic changes on x-rays back on 02/25/2014. Patient was given conservative therapy including home exercises, icing protocol, over-the-counter medications. We also discussed the possibility of surgical intervention. Patient states she did not make any improvement and actually states that the physical therapy may worsen it. Patient still is having more pain with ambulation. Patient states that she would like to wait till after the holidays before she would even have surgery. Patient did have a intra-articular injection in the hip multiple years ago that did give her about 2 months of relief. Patient is still able to do activities of daily living but is not able to do much more than that. Pain can wake her up at night.     Past medical history, social, surgical and family history all reviewed in electronic medical record.   Review of Systems: No headache, visual changes, nausea, vomiting, diarrhea, constipation, dizziness, abdominal pain, skin rash, fevers, chills, night sweats, weight loss, swollen lymph nodes, body aches, joint swelling, muscle aches, chest pain, shortness of breath, mood changes.   Objective Blood pressure 126/62, pulse 105, height 5\' 3"  (1.6 m), weight 114 lb (51.71 kg), SpO2 96 %.  General: No apparent distress alert and oriented x3 mood and affect normal, dressed appropriately.  HEENT: Pupils equal, extraocular movements intact  Respiratory: Patient's speak in full sentences and does not appear short of  breath  Cardiovascular: No lower extremity edema, non tender, no erythema  Skin: Warm dry intact with no signs of infection or rash on extremities or on axial skeleton.  Abdomen: Soft nontender  Neuro: Cranial nerves II through XII are intact, neurovascularly intact in all extremities with 2+ DTRs and 2+ pulses.  Lymph: No lymphadenopathy of posterior or anterior cervical chain or axillae bilaterally.  Gait normal with good balance and coordination.  MSK:  Non tender with full range of motion and good stability and symmetric strength and tone of shoulders, elbows, wrist,  knee and ankles bilaterally.  EPP:IRJJO ROM IR: 81 Deg with moderate pain, ER: 45 Deg, Flexion: 120 Deg, Extension: 100 Deg, Abduction: 45 Deg, Adduction: 45 Deg Strength IR: 4/5, ER: 5/5, Flexion: 5/5, Extension: 5/5, Abduction: 3/5, Adduction: 5/5 Pelvic alignment unremarkable to inspection and palpation. Standing hip rotation and gait without trendelenburg sign / unsteadiness. Greater trochanter without tenderness to palpation. No tenderness over piriformis and greater trochanter. + FADIR  Mild pain over the right SI joint Contralateral hip is unremarkable. Patient's get up and go normal   Procedure: Real-time Ultrasound Guided Injection of right hip Device: GE Logiq E  Ultrasound guided injection is preferred based studies that show increased duration, increased effect, greater accuracy, decreased procedural pain, increased response rate with ultrasound guided versus blind injection.  Verbal informed consent obtained.  Time-out conducted.  Noted no overlying erythema, induration, or other signs of local infection.  Skin prepped in a sterile fashion.  Local anesthesia: Topical Ethyl chloride.  With sterile technique and under real time ultrasound guidance:  Anterior capsule visualized, needle visualized going to  the head neck junction at the anterior capsule. Pictures taken. Patient did have injection of 3 cc of 1%  lidocaine, 3 cc of 0.5% Marcaine, and 1 cc of Kenalog 40 mg/dL. Completed without difficulty  Pain immediately resolved suggesting accurate placement of the medication.  Advised to call if fevers/chills, erythema, induration, drainage, or persistent bleeding.  Images permanently stored and available for review in the ultrasound unit.  Impression: Technically successful ultrasound guided injection.    Impression and Recommendations:     This case required medical decision making of moderate complexity.

## 2014-03-18 NOTE — Patient Instructions (Addendum)
It is good to see you Gabriella Jackson is your friend.  Conitnue the exercises. As long as they do not hurt Important to stay active.  Did injection again today and hopefully will help I do recommend surgery at some point See me again in 4 weeks.

## 2014-07-20 ENCOUNTER — Ambulatory Visit: Payer: Medicare HMO | Admitting: Family Medicine

## 2014-10-03 ENCOUNTER — Other Ambulatory Visit: Payer: Self-pay

## 2014-10-20 ENCOUNTER — Encounter (HOSPITAL_COMMUNITY): Payer: Self-pay

## 2014-10-26 ENCOUNTER — Encounter (HOSPITAL_COMMUNITY)
Admission: RE | Admit: 2014-10-26 | Discharge: 2014-10-26 | Disposition: A | Discharge status: Home / Self Care | Payer: Medicare Other | Source: Ambulatory Visit | Attending: Orthopedic Surgery | Admitting: Orthopedic Surgery

## 2014-10-26 ENCOUNTER — Encounter (HOSPITAL_COMMUNITY): Payer: Self-pay

## 2014-10-26 DIAGNOSIS — M1611 Unilateral primary osteoarthritis, right hip: Secondary | ICD-10-CM | POA: Diagnosis not present

## 2014-10-26 DIAGNOSIS — I440 Atrioventricular block, first degree: Secondary | ICD-10-CM | POA: Insufficient documentation

## 2014-10-26 DIAGNOSIS — Z0183 Encounter for blood typing: Secondary | ICD-10-CM | POA: Insufficient documentation

## 2014-10-26 DIAGNOSIS — Z01812 Encounter for preprocedural laboratory examination: Secondary | ICD-10-CM | POA: Insufficient documentation

## 2014-10-26 HISTORY — DX: Unilateral inguinal hernia, without obstruction or gangrene, not specified as recurrent: K40.90

## 2014-10-26 HISTORY — DX: Nausea with vomiting, unspecified: R11.2

## 2014-10-26 HISTORY — DX: Other specified postprocedural states: Z98.890

## 2014-10-26 LAB — CBC
HEMATOCRIT: 40.2 % (ref 36.0–46.0)
HEMOGLOBIN: 13.1 g/dL (ref 12.0–15.0)
MCH: 31.9 pg (ref 26.0–34.0)
MCHC: 32.6 g/dL (ref 30.0–36.0)
MCV: 97.8 fL (ref 78.0–100.0)
PLATELETS: 228 10*3/uL (ref 150–400)
RBC: 4.11 MIL/uL (ref 3.87–5.11)
RDW: 13.2 % (ref 11.5–15.5)
WBC: 6.3 10*3/uL (ref 4.0–10.5)

## 2014-10-26 LAB — BASIC METABOLIC PANEL
Anion gap: 11 (ref 5–15)
BUN: 17 mg/dL (ref 6–20)
CALCIUM: 9.3 mg/dL (ref 8.9–10.3)
CO2: 26 mmol/L (ref 22–32)
CREATININE: 0.67 mg/dL (ref 0.44–1.00)
Chloride: 102 mmol/L (ref 101–111)
GFR calc non Af Amer: >60 mL/min (ref >60)
Glucose, Bld: 96 mg/dL (ref 65–99)
Potassium: 3.9 mmol/L (ref 3.5–5.1)
Sodium: 139 mmol/L (ref 135–145)

## 2014-10-26 LAB — URINALYSIS, ROUTINE W REFLEX MICROSCOPIC
BILIRUBIN URINE: NEGATIVE
Glucose, UA: NEGATIVE mg/dL
Ketones, ur: NEGATIVE mg/dL
NITRITE: POSITIVE — AB
PROTEIN: NEGATIVE mg/dL
Specific Gravity, Urine: 1.017 (ref 1.005–1.030)
Urobilinogen, UA: 0.2 mg/dL (ref 0.0–1.0)
pH: 5.5 (ref 5.0–8.0)

## 2014-10-26 LAB — SURGICAL PCR SCREEN
MRSA, PCR: NEGATIVE
STAPHYLOCOCCUS AUREUS: NEGATIVE

## 2014-10-26 LAB — PROTIME-INR
INR: 0.95 (ref 0.00–1.49)
Prothrombin Time: 12.9 seconds (ref 11.6–15.2)

## 2014-10-26 LAB — URINE MICROSCOPIC-ADD ON

## 2014-10-26 LAB — APTT: APTT: 35 s (ref 24–37)

## 2014-10-26 LAB — ABO/RH: ABO/RH(D): O POS

## 2014-10-26 NOTE — Pre-Procedure Instructions (Signed)
Labs viewable in Epic- note urinalysis.

## 2014-10-26 NOTE — Patient Instructions (Addendum)
[place]  10/26/2014   Your procedure is scheduled on:   -11-01-2014 Tuesday  Enter through [facility]  Entrance and follow signs to [facility] Arrive at      0515  AM.  (Limit 1 person with you).  Call this number if you have problems the morning of surgery: [phone]  Or Presurgical Testing [phone].   For Living Will and/or Health Care Power Attorney Forms: please provide copy for your medical record,may bring AM of surgery(Forms should be already notarized -we do not provide this service).(10-26-14 Yes/ No information preferred today).     Do not eat food/ or drink: After Midnight.     Take these medicines the morning of surgery with A SIP OF WATER: Citalopram. Diltiazem. Flecainide. Loratadine.   Do not wear jewelry, make-up or nail polish.  Do not wear deodorant, lotions, powders, or perfumes.   Do not shave legs and under arms- 48 hours(2 days) prior to first CHG shower.(Shaving face and neck okay.)  Do not bring valuables to the hospital.(Hospital is not responsible for lost valuables).  Contacts, dentures or removable bridgework, body piercing, hair pins may not be worn into surgery.  Leave suitcase in the car. After surgery it may be brought to your room.  For patients admitted to the hospital, checkout time is 11:00 AM the day of discharge.(Restricted visitors-Any Persons displaying flu-like symptoms or illness).    Patients discharged the day of surgery will not be allowed to drive home. Must have responsible person with you x 24 hours once discharged.  Name and phone number of your driver: Daughter Deeann Saint - [phone]     Please read over the following fact sheets that you were given:  CHG(Chlorhexidine Gluconate 4% Surgical Soap) use, MRSA Information, Blood Transfusion fact sheet, Incentive Spirometry Instruction.  Remember : Type/Screen "Blue armbands" - may not be removed once applied(would result in being retested AM of  surgery, if removed).         [place] - Preparing for Surgery Before surgery, you can play an important role.  Because skin is not sterile, your skin needs to be as free of germs as possible.  You can reduce the number of germs on your skin by washing with CHG (chlorahexidine gluconate) soap before surgery.  CHG is an antiseptic cleaner which kills germs and bonds with the skin to continue killing germs even after washing. Please DO NOT use if you have an allergy to CHG or antibacterial soaps.  If your skin becomes reddened/irritated stop using the CHG and inform your nurse when you arrive at Short Stay. Do not shave (including legs and underarms) for at least 48 hours prior to the first CHG shower.  You may shave your face/neck. Please follow these instructions carefully:  1.  Shower with CHG Soap the night before surgery and the  morning of Surgery.  2.  If you choose to wash your hair, wash your hair first as usual with your  normal  shampoo.  3.  After you shampoo, rinse your hair and body thoroughly to remove the  shampoo.                           4.  Use CHG as you would any other liquid soap.  You can apply chg directly  to the skin and wash  Gently with a scrungie or clean washcloth.  5.  Apply the CHG Soap to your body ONLY FROM THE NECK DOWN.   Do not use on face/ open                           Wound or open sores. Avoid contact with eyes, ears mouth and genitals (private parts).                       Wash face,  Genitals (private parts) with your normal soap.             6.  Wash thoroughly, paying special attention to the area where your surgery  will be performed.  7.  Thoroughly rinse your body with warm water from the neck down.  8.  DO NOT shower/wash with your normal soap after using and rinsing off  the CHG Soap.                9.  Pat yourself dry with a clean towel.            10.  Wear clean pajamas.            11.  Place clean sheets on your bed  the night of your first shower and do not  sleep with pets. Day of Surgery : Do not apply any lotions/deodorants the morning of surgery.  Please wear clean clothes to the hospital/surgery center.  FAILURE TO FOLLOW THESE INSTRUCTIONS MAY RESULT IN THE CANCELLATION OF YOUR SURGERY PATIENT SIGNATURE_________________________________  NURSE SIGNATURE__________________________________  ________________________________________________________________________   Adam Phenix  An incentive spirometer is a tool that can help keep your lungs clear and active. This tool measures how well you are filling your lungs with each breath. Taking long deep breaths may help reverse or decrease the chance of developing breathing (pulmonary) problems (especially infection) following:  A long period of time when you are unable to move or be active. BEFORE THE PROCEDURE   If the spirometer includes an indicator to show your best effort, your nurse or respiratory therapist will set it to a desired goal.  If possible, sit up straight or lean slightly forward. Try not to slouch.  Hold the incentive spirometer in an upright position. INSTRUCTIONS FOR USE   Sit on the edge of your bed if possible, or sit up as far as you can in bed or on a chair.  Hold the incentive spirometer in an upright position.  Breathe out normally.  Place the mouthpiece in your mouth and seal your lips tightly around it.  Breathe in slowly and as deeply as possible, raising the piston or the ball toward the top of the column.  Hold your breath for 3-5 seconds or for as long as possible. Allow the piston or ball to fall to the bottom of the column.  Remove the mouthpiece from your mouth and breathe out normally.  Rest for a few seconds and repeat Steps 1 through 7 at least 10 times every 1-2 hours when you are awake. Take your time and take a few normal breaths between deep breaths.  The spirometer may include an indicator  to show your best effort. Use the indicator as a goal to work toward during each repetition.  After each set of 10 deep breaths, practice coughing to be sure your lungs are clear. If you have an incision (the cut made at the time of  surgery), support your incision when coughing by placing a pillow or rolled up towels firmly against it. Once you are able to get out of bed, walk around indoors and cough well. You may stop using the incentive spirometer when instructed by your caregiver.  RISKS AND COMPLICATIONS  Take your time so you do not get dizzy or light-headed.  If you are in pain, you may need to take or ask for pain medication before doing incentive spirometry. It is harder to take a deep breath if you are having pain. AFTER USE  Rest and breathe slowly and easily.  It can be helpful to keep track of a log of your progress. Your caregiver can provide you with a simple table to help with this. If you are using the spirometer at home, follow these instructions: [place] IF:   You are having difficultly using the spirometer.  You have trouble using the spirometer as often as instructed.  Your pain medication is not giving enough relief while using the spirometer.  You develop fever of 100.5 F (38.1 C) or higher. SEEK IMMEDIATE MEDICAL CARE IF:   You cough up bloody sputum that had not been present before.  You develop fever of 102 F (38.9 C) or greater.  You develop worsening pain at or near the incision site. MAKE SURE YOU:   Understand these instructions.  Will watch your condition.  Will get help right away if you are not doing well or get worse. Document Released: 08/05/2006 Document Revised: 06/17/2011 Document Reviewed: 10/06/2006 ExitCare Patient Information 2014 Exit[employer], [place].   ________________________________________________________________________  WHAT IS A BLOOD TRANSFUSION? Blood Transfusion Information  A transfusion is the replacement of  blood or some of its parts. Blood is made up of multiple cells which provide different functions.  Red blood cells carry oxygen and are used for blood loss replacement.  White blood cells fight against infection.  Platelets control bleeding.  Plasma helps clot blood.  Other blood products are available for specialized needs, such as hemophilia or other clotting disorders. BEFORE THE TRANSFUSION  Who gives blood for transfusions?   Healthy volunteers who are fully evaluated to make sure their blood is safe. This is blood bank blood. Transfusion therapy is the safest it has ever been in the practice of medicine. Before blood is taken from a donor, a complete history is taken to make sure that person has no history of diseases nor engages in risky social behavior (examples are intravenous drug use or sexual activity with multiple partners). The donor's travel history is screened to minimize risk of transmitting infections, such as malaria. The donated blood is tested for signs of infectious diseases, such as HIV and hepatitis. The blood is then tested to be sure it is compatible with you in order to minimize the chance of a transfusion reaction. If you or a relative donates blood, this is often done in anticipation of surgery and is not appropriate for emergency situations. It takes many days to process the donated blood. RISKS AND COMPLICATIONS Although transfusion therapy is very safe and saves many lives, the main dangers of transfusion include:   Getting an infectious disease.  Developing a transfusion reaction. This is an allergic reaction to something in the blood you were given. Every precaution is taken to prevent this. The decision to have a blood transfusion has been considered carefully by your caregiver before blood is given. Blood is not given unless the benefits outweigh the risks. AFTER THE TRANSFUSION  Right after receiving a blood transfusion, you will usually feel much better  and more energetic. This is especially true if your red blood cells have gotten low (anemic). The transfusion raises the level of the red blood cells which carry oxygen, and this usually causes an energy increase.  The nurse administering the transfusion will monitor you carefully for complications. HOME CARE INSTRUCTIONS  No special instructions are needed after a transfusion. You may find your energy is better. Speak with your caregiver about any limitations on activity for underlying diseases you may have. SEEK MEDICAL CARE IF:   Your condition is not improving after your transfusion.  You develop redness or irritation at the intravenous (IV) site. SEEK IMMEDIATE MEDICAL CARE IF:  Any of the following symptoms occur over the next 12 hours:  Shaking chills.  You have a temperature by mouth above 102 F (38.9 C), not controlled by medicine.  Chest, back, or muscle pain.  People around you feel you are not acting correctly or are confused.  Shortness of breath or difficulty breathing.  Dizziness and fainting.  You get a rash or develop hives.  You have a decrease in urine output.  Your urine turns a dark color or changes to pink, red, or brown. Any of the following symptoms occur over the next 10 days:  You have a temperature by mouth above 102 F (38.9 C), not controlled by medicine.  Shortness of breath.  Weakness after normal activity.  The white part of the eye turns yellow (jaundice).  You have a decrease in the amount of urine or are urinating less often.  Your urine turns a dark color or changes to pink, red, or brown. Document Released: 03/22/2000 Document Revised: 06/17/2011 Document Reviewed: 11/09/2007 [facility] Patient Information 2014 [place], [place].  _______________________________________________________________________

## 2014-10-26 NOTE — Progress Notes (Signed)
10-26-14 1540 Labs viewable in [place]. Note urinalysis- note sent to office [phone].

## 2014-10-28 NOTE — H&P (Signed)
TOTAL HIP ADMISSION H&P  Patient is admitted for right total hip arthroplasty.  Subjective:  Chief Complaint: Right hip primary OA / pain  HPI: Gabriella Jackson, 81 y.o. female, has a history of pain and functional disability in the right hip(s) due to arthritis and patient has failed non-surgical conservative treatments for greater than 12 weeks to include NSAID's and/or analgesics, corticosteriod injections, use of assistive devices and activity modification.  Onset of symptoms was gradual starting 4-5 years ago with gradually worsening course since that time.The patient noted no past surgery on the right hip(s).  Patient currently rates pain in the right hip at 9 out of 10 with activity. Patient has worsening of pain with activity and weight bearing, trendelenberg gait, pain that interfers with activities of daily living and pain with passive range of motion. Patient has evidence of periarticular osteophytes and joint space narrowing by imaging studies. This condition presents safety issues increasing the risk of falls.  There is no current active infection.  Risks, benefits and expectations were discussed with the patient.  Risks including but not limited to the risk of anesthesia, blood clots, nerve damage, blood vessel damage, failure of the prosthesis, infection and up to and including death.  Patient understand the risks, benefits and expectations and wishes to proceed with surgery.   PCP: Mathews Argyle, MD  D/C Plans:      SNF  [facility])  Post-op Meds:       No Rx given  Tranexamic Acid:      To be given - IV  Decadron:      Is to be given  FYI:     ?  ASA ( ITP)  ? Analgesic medication   Patient Active Problem List   Diagnosis Date Noted  . Arthritis of right hip 02/25/2014  . SVT (supraventricular tachycardia) 01/27/2013   Past Medical History  Diagnosis Date  . Fuchs' corneal dystrophy 2002  . ITP (idiopathic thrombocytopenic purpura)     s/p splenectomy-  remission  . SVT (supraventricular tachycardia)   . Urticaria   . Depressed   . DJD (degenerative joint disease)   . Osteopenia   . Dysrhythmia     hx SVT  . PONV (postoperative nausea and vomiting)   . Colon cancer 2002    s/p colonic resection- chemotherapy and surgery  . Hernia, inguinal, right     Past Surgical History  Procedure Laterality Date  . Colon surgery    . Abdominal hysterectomy    . Small intestine surgery    . Splenectomy  1999  . Eye surgery      corneal replacements and cataracts removed.  . Rotator cuff repair Right   . Foot surgery      Foreign body" imbedded sewing needle"    No prescriptions prior to admission   Allergies  Allergen Reactions  . Codeine Nausea Only  . Sulfa Antibiotics     Cant remember    History  Substance Use Topics  . Smoking status: Never Smoker   . Smokeless tobacco: Not on file  . Alcohol Use: 3.5 oz/week    7 Standard drinks or equivalent per week     Comment: small glass of wine each day    Family History  Problem Relation Age of Onset  . CAD       Review of Systems  Constitutional: Negative.   HENT: Negative.   Eyes: Negative.   Respiratory: Negative.   Cardiovascular: Negative.   Gastrointestinal: Negative.  Genitourinary: Positive for frequency.  Musculoskeletal: Positive for back pain and joint pain.  Skin: Negative.   Neurological: Negative.   Endo/Heme/Allergies: Negative.   Psychiatric/Behavioral: Positive for depression.    Objective:  Physical Exam  Constitutional: She is oriented to person, place, and time. She appears well-developed and well-nourished.  HENT:  Head: Normocephalic and atraumatic.  Eyes: Pupils are equal, round, and reactive to light.  Neck: Neck supple. No JVD present. No tracheal deviation present. No thyromegaly present.  Cardiovascular: Normal rate, regular rhythm, normal heart sounds and intact distal pulses.   Respiratory: Effort normal and breath sounds normal. No  stridor. No respiratory distress. She has no wheezes.  GI: Soft. There is no tenderness. There is no guarding.  Musculoskeletal:       Right hip: She exhibits decreased range of motion, decreased strength, tenderness and bony tenderness. She exhibits no swelling, no deformity and no laceration.  Lymphadenopathy:    She has no cervical adenopathy.  Neurological: She is alert and oriented to person, place, and time.  Skin: Skin is warm and dry.  Psychiatric: She has a normal mood and affect.     Labs:  Estimated body mass index is 20.20 kg/(m^2) as calculated from the following:   Height as of 03/18/14: 5\' 3"  (1.6 m).   Weight as of 03/18/14: 51.71 kg (114 lb).   Imaging Review Plain radiographs demonstrate severe degenerative joint disease of the right hip(s). The bone quality appears to be good for age and reported activity level.  Assessment/Plan:  End stage arthritis, right hip(s)  The patient history, physical examination, clinical judgement of the provider and imaging studies are consistent with end stage degenerative joint disease of the right hip(s) and total hip arthroplasty is deemed medically necessary. The treatment options including medical management, injection therapy, arthroscopy and arthroplasty were discussed at length. The risks and benefits of total hip arthroplasty were presented and reviewed. The risks due to aseptic loosening, infection, stiffness, dislocation/subluxation,  thromboembolic complications and other imponderables were discussed.  The patient acknowledged the explanation, agreed to proceed with the plan and consent was signed. Patient is being admitted for inpatient treatment for surgery, pain control, PT, OT, prophylactic antibiotics, VTE prophylaxis, progressive ambulation and ADL's and discharge planning.The patient is planning to be discharged to skilled nursing facility.     [name] Jaimen Melone   PA-C  10/28/2014, 9:36 AM

## 2014-11-01 ENCOUNTER — Encounter (HOSPITAL_COMMUNITY): Payer: Self-pay | Admitting: *Deleted

## 2014-11-01 ENCOUNTER — Inpatient Hospital Stay (HOSPITAL_COMMUNITY): Payer: Medicare Other

## 2014-11-01 ENCOUNTER — Encounter (HOSPITAL_COMMUNITY)
Admission: RE | Disposition: A | Discharge status: Skilled Nursing Facility | Payer: Self-pay | Source: Ambulatory Visit | Attending: Orthopedic Surgery

## 2014-11-01 ENCOUNTER — Inpatient Hospital Stay (HOSPITAL_COMMUNITY)
Admission: RE | Admit: 2014-11-01 | Discharge: 2014-11-04 | DRG: 470 | Disposition: A | Discharge status: Skilled Nursing Facility | Payer: Medicare Other | Source: Ambulatory Visit | Attending: Orthopedic Surgery | Admitting: Orthopedic Surgery

## 2014-11-01 ENCOUNTER — Inpatient Hospital Stay (HOSPITAL_COMMUNITY): Payer: Medicare Other | Admitting: Anesthesiology

## 2014-11-01 DIAGNOSIS — M8580 Other specified disorders of bone density and structure, unspecified site: Secondary | ICD-10-CM | POA: Diagnosis present

## 2014-11-01 DIAGNOSIS — Z85038 Personal history of other malignant neoplasm of large intestine: Secondary | ICD-10-CM | POA: Diagnosis not present

## 2014-11-01 DIAGNOSIS — I10 Essential (primary) hypertension: Secondary | ICD-10-CM | POA: Diagnosis present

## 2014-11-01 DIAGNOSIS — Z01812 Encounter for preprocedural laboratory examination: Secondary | ICD-10-CM | POA: Diagnosis not present

## 2014-11-01 DIAGNOSIS — Z9081 Acquired absence of spleen: Secondary | ICD-10-CM | POA: Diagnosis present

## 2014-11-01 DIAGNOSIS — Z79899 Other long term (current) drug therapy: Secondary | ICD-10-CM

## 2014-11-01 DIAGNOSIS — M1611 Unilateral primary osteoarthritis, right hip: Secondary | ICD-10-CM | POA: Diagnosis present

## 2014-11-01 DIAGNOSIS — Z8249 Family history of ischemic heart disease and other diseases of the circulatory system: Secondary | ICD-10-CM | POA: Diagnosis not present

## 2014-11-01 DIAGNOSIS — M25551 Pain in right hip: Secondary | ICD-10-CM | POA: Diagnosis present

## 2014-11-01 DIAGNOSIS — Z96649 Presence of unspecified artificial hip joint: Secondary | ICD-10-CM

## 2014-11-01 HISTORY — PX: TOTAL HIP ARTHROPLASTY: SHX124

## 2014-11-01 LAB — TYPE AND SCREEN
ABO/RH(D): O POS
Antibody Screen: NEGATIVE

## 2014-11-01 SURGERY — ARTHROPLASTY, HIP, TOTAL, ANTERIOR APPROACH
Anesthesia: General | Site: Hip | Laterality: Right

## 2014-11-01 MED ORDER — FENTANYL CITRATE (PF) 100 MCG/2ML IJ SOLN
25.0000 ug | INTRAMUSCULAR | Status: DC | PRN
  Administered 2014-11-01 (×5): 25 ug via INTRAVENOUS

## 2014-11-01 MED ORDER — DILTIAZEM HCL ER COATED BEADS 120 MG PO CP24
120.0000 mg | ORAL_CAPSULE | Freq: Every day | ORAL | Status: DC
  Administered 2014-11-02 – 2014-11-04 (×3): 120 mg via ORAL
  Filled 2014-11-01 (×3): qty 1

## 2014-11-01 MED ORDER — FLECAINIDE ACETATE 50 MG PO TABS
50.0000 mg | ORAL_TABLET | Freq: Two times a day (BID) | ORAL | Status: DC
  Administered 2014-11-01 – 2014-11-04 (×5): 50 mg via ORAL
  Filled 2014-11-01 (×7): qty 1

## 2014-11-01 MED ORDER — CEFAZOLIN SODIUM-DEXTROSE 2-3 GM-% IV SOLR
2.0000 g | INTRAVENOUS | Status: AC
  Administered 2014-11-01: 2 g via INTRAVENOUS

## 2014-11-01 MED ORDER — ONDANSETRON HCL 4 MG/2ML IJ SOLN
INTRAMUSCULAR | Status: AC
  Filled 2014-11-01: qty 2

## 2014-11-01 MED ORDER — ROCURONIUM BROMIDE 100 MG/10ML IV SOLN
INTRAVENOUS | Status: DC | PRN
  Administered 2014-11-01: 30 mg via INTRAVENOUS
  Administered 2014-11-01: 10 mg via INTRAVENOUS

## 2014-11-01 MED ORDER — CITALOPRAM HYDROBROMIDE 20 MG PO TABS
20.0000 mg | ORAL_TABLET | Freq: Every morning | ORAL | Status: DC
  Administered 2014-11-02 – 2014-11-04 (×3): 20 mg via ORAL
  Filled 2014-11-01 (×3): qty 1

## 2014-11-01 MED ORDER — ASPIRIN EC 325 MG PO TBEC
325.0000 mg | DELAYED_RELEASE_TABLET | Freq: Two times a day (BID) | ORAL | Status: DC
  Administered 2014-11-02 – 2014-11-04 (×5): 325 mg via ORAL
  Filled 2014-11-01 (×7): qty 1

## 2014-11-01 MED ORDER — ROCURONIUM BROMIDE 100 MG/10ML IV SOLN
INTRAVENOUS | Status: AC
  Filled 2014-11-01: qty 1

## 2014-11-01 MED ORDER — PROPOFOL 10 MG/ML IV BOLUS
INTRAVENOUS | Status: AC
  Filled 2014-11-01: qty 20

## 2014-11-01 MED ORDER — FERROUS SULFATE 325 (65 FE) MG PO TABS
325.0000 mg | ORAL_TABLET | Freq: Three times a day (TID) | ORAL | Status: DC
  Administered 2014-11-01 – 2014-11-04 (×6): 325 mg via ORAL
  Filled 2014-11-01 (×12): qty 1

## 2014-11-01 MED ORDER — HYDROMORPHONE HCL 1 MG/ML IJ SOLN: 0.5000 mg | INTRAMUSCULAR | Status: DC | PRN

## 2014-11-01 MED ORDER — LACTATED RINGERS IV SOLN
INTRAVENOUS | Status: DC | PRN
  Administered 2014-11-01 (×2): via INTRAVENOUS

## 2014-11-01 MED ORDER — METHOCARBAMOL 500 MG PO TABS
500.0000 mg | ORAL_TABLET | Freq: Four times a day (QID) | ORAL | Status: DC | PRN
  Administered 2014-11-03: 500 mg via ORAL
  Filled 2014-11-01: qty 1

## 2014-11-01 MED ORDER — METOCLOPRAMIDE HCL 5 MG/ML IJ SOLN: 5.0000 mg | Freq: Three times a day (TID) | INTRAMUSCULAR | Status: DC | PRN

## 2014-11-01 MED ORDER — CEFAZOLIN SODIUM-DEXTROSE 2-3 GM-% IV SOLR
2.0000 g | Freq: Four times a day (QID) | INTRAVENOUS | Status: AC
  Administered 2014-11-01 (×2): 2 g via INTRAVENOUS
  Filled 2014-11-01 (×2): qty 50

## 2014-11-01 MED ORDER — PHENYLEPHRINE HCL 10 MG/ML IJ SOLN
INTRAMUSCULAR | Status: DC | PRN
  Administered 2014-11-01 (×2): 80 ug via INTRAVENOUS

## 2014-11-01 MED ORDER — DEXAMETHASONE SODIUM PHOSPHATE 10 MG/ML IJ SOLN
INTRAMUSCULAR | Status: DC | PRN
  Administered 2014-11-01: 10 mg via INTRAVENOUS

## 2014-11-01 MED ORDER — METOCLOPRAMIDE HCL 10 MG PO TABS: 5.0000 mg | ORAL_TABLET | Freq: Three times a day (TID) | ORAL | Status: DC | PRN

## 2014-11-01 MED ORDER — TRANEXAMIC ACID 1000 MG/10ML IV SOLN
1000.0000 mg | Freq: Once | INTRAVENOUS | Status: AC
  Administered 2014-11-01: 1000 mg via INTRAVENOUS
  Filled 2014-11-01: qty 10

## 2014-11-01 MED ORDER — CEFAZOLIN SODIUM-DEXTROSE 2-3 GM-% IV SOLR
INTRAVENOUS | Status: AC
  Filled 2014-11-01: qty 50

## 2014-11-01 MED ORDER — DIPHENHYDRAMINE HCL 25 MG PO CAPS
25.0000 mg | ORAL_CAPSULE | Freq: Four times a day (QID) | ORAL | Status: DC | PRN
  Administered 2014-11-02: 25 mg via ORAL
  Filled 2014-11-01: qty 1

## 2014-11-01 MED ORDER — FENTANYL CITRATE (PF) 100 MCG/2ML IJ SOLN
INTRAMUSCULAR | Status: AC
  Filled 2014-11-01: qty 2

## 2014-11-01 MED ORDER — SUCCINYLCHOLINE CHLORIDE 20 MG/ML IJ SOLN
INTRAMUSCULAR | Status: DC | PRN
  Administered 2014-11-01: 100 mg via INTRAVENOUS

## 2014-11-01 MED ORDER — DEXAMETHASONE SODIUM PHOSPHATE 10 MG/ML IJ SOLN: 10.0000 mg | Freq: Once | INTRAMUSCULAR | Status: DC

## 2014-11-01 MED ORDER — HYDROCODONE-ACETAMINOPHEN 7.5-325 MG PO TABS
1.0000 | ORAL_TABLET | ORAL | Status: DC | PRN
  Administered 2014-11-01 – 2014-11-03 (×6): 1 via ORAL
  Filled 2014-11-01 (×6): qty 1

## 2014-11-01 MED ORDER — LORATADINE 10 MG PO TABS
10.0000 mg | ORAL_TABLET | Freq: Every day | ORAL | Status: DC
  Administered 2014-11-02 – 2014-11-04 (×3): 10 mg via ORAL
  Filled 2014-11-01 (×3): qty 1

## 2014-11-01 MED ORDER — PROPOFOL 10 MG/ML IV BOLUS
INTRAVENOUS | Status: DC | PRN
  Administered 2014-11-01: 100 mg via INTRAVENOUS

## 2014-11-01 MED ORDER — ONDANSETRON HCL 4 MG/2ML IJ SOLN
INTRAMUSCULAR | Status: DC | PRN
  Administered 2014-11-01: 4 mg via INTRAVENOUS

## 2014-11-01 MED ORDER — CHLORHEXIDINE GLUCONATE 4 % EX LIQD: 60.0000 mL | Freq: Once | CUTANEOUS | Status: DC

## 2014-11-01 MED ORDER — PHENOL 1.4 % MT LIQD: 1.0000 | OROMUCOSAL | Status: DC | PRN

## 2014-11-01 MED ORDER — PHENYLEPHRINE HCL 10 MG/ML IJ SOLN
INTRAMUSCULAR | Status: AC
  Filled 2014-11-01: qty 1

## 2014-11-01 MED ORDER — MENTHOL 3 MG MT LOZG
1.0000 | LOZENGE | OROMUCOSAL | Status: DC | PRN
  Administered 2014-11-01: 3 mg via ORAL
  Filled 2014-11-01: qty 9

## 2014-11-01 MED ORDER — SODIUM CHLORIDE 0.9 % IR SOLN
Status: DC | PRN
  Administered 2014-11-01: 1000 mL

## 2014-11-01 MED ORDER — ALUM & MAG HYDROXIDE-SIMETH 200-200-20 MG/5ML PO SUSP: 30.0000 mL | ORAL | Status: DC | PRN

## 2014-11-01 MED ORDER — ONDANSETRON HCL 4 MG/2ML IJ SOLN: 4.0000 mg | Freq: Four times a day (QID) | INTRAMUSCULAR | Status: DC | PRN

## 2014-11-01 MED ORDER — DEXAMETHASONE SODIUM PHOSPHATE 10 MG/ML IJ SOLN
INTRAMUSCULAR | Status: AC
  Filled 2014-11-01: qty 1

## 2014-11-01 MED ORDER — METHOCARBAMOL 1000 MG/10ML IJ SOLN
500.0000 mg | Freq: Four times a day (QID) | INTRAVENOUS | Status: DC | PRN
  Administered 2014-11-01: 500 mg via INTRAVENOUS
  Filled 2014-11-01 (×2): qty 5

## 2014-11-01 MED ORDER — LIDOCAINE HCL (CARDIAC) 20 MG/ML IV SOLN
INTRAVENOUS | Status: DC | PRN
  Administered 2014-11-01: 100 mg via INTRAVENOUS

## 2014-11-01 MED ORDER — ONDANSETRON HCL 4 MG PO TABS: 4.0000 mg | ORAL_TABLET | Freq: Four times a day (QID) | ORAL | Status: DC | PRN

## 2014-11-01 MED ORDER — SODIUM CHLORIDE 0.9 % IV SOLN
100.0000 mL/h | INTRAVENOUS | Status: DC
  Administered 2014-11-01 – 2014-11-02 (×2): 100 mL/h via INTRAVENOUS
  Filled 2014-11-01 (×5): qty 1000

## 2014-11-01 MED ORDER — BUPIVACAINE HCL (PF) 0.5 % IJ SOLN
INTRAMUSCULAR | Status: AC
  Filled 2014-11-01: qty 30

## 2014-11-01 MED ORDER — GLYCOPYRROLATE 0.2 MG/ML IJ SOLN
INTRAMUSCULAR | Status: DC | PRN
  Administered 2014-11-01: 0.4 mg via INTRAVENOUS

## 2014-11-01 MED ORDER — FENTANYL CITRATE (PF) 100 MCG/2ML IJ SOLN
INTRAMUSCULAR | Status: DC | PRN
  Administered 2014-11-01 (×4): 25 ug via INTRAVENOUS

## 2014-11-01 MED ORDER — NEOSTIGMINE METHYLSULFATE 10 MG/10ML IV SOLN
INTRAVENOUS | Status: DC | PRN
  Administered 2014-11-01: 3 mg via INTRAVENOUS

## 2014-11-01 MED ORDER — POLYETHYLENE GLYCOL 3350 17 G PO PACK
17.0000 g | PACK | Freq: Two times a day (BID) | ORAL | Status: DC
  Administered 2014-11-01 – 2014-11-03 (×3): 17 g via ORAL

## 2014-11-01 MED ORDER — DEXAMETHASONE SODIUM PHOSPHATE 10 MG/ML IJ SOLN
10.0000 mg | Freq: Once | INTRAMUSCULAR | Status: AC
Start: 2014-11-02 — End: 2014-11-02
  Administered 2014-11-02: 10 mg via INTRAVENOUS
  Filled 2014-11-01: qty 1

## 2014-11-01 MED ORDER — MAGNESIUM CITRATE PO SOLN: 1.0000 | Freq: Once | ORAL | Status: AC | PRN

## 2014-11-01 MED ORDER — DOCUSATE SODIUM 100 MG PO CAPS
100.0000 mg | ORAL_CAPSULE | Freq: Two times a day (BID) | ORAL | Status: DC
Start: 2014-11-01 — End: 2014-11-04
  Administered 2014-11-01 – 2014-11-04 (×5): 100 mg via ORAL

## 2014-11-01 MED ORDER — BISACODYL 10 MG RE SUPP: 10.0000 mg | Freq: Every day | RECTAL | Status: DC | PRN

## 2014-11-01 MED ORDER — LIDOCAINE HCL (CARDIAC) 20 MG/ML IV SOLN
INTRAVENOUS | Status: AC
  Filled 2014-11-01: qty 5

## 2014-11-01 SURGICAL SUPPLY — 44 items
BAG DECANTER FOR FLEXI CONT (MISCELLANEOUS) IMPLANT
BAG SPEC THK2 15X12 ZIP CLS (MISCELLANEOUS)
BAG ZIPLOCK 12X15 (MISCELLANEOUS) IMPLANT
CAPT HIP TOTAL 2 ×1 IMPLANT
COVER PERINEAL POST (MISCELLANEOUS) ×2 IMPLANT
DRAPE C-ARM 42X120 X-RAY (DRAPES) ×2 IMPLANT
DRAPE STERI IOBAN 125X83 (DRAPES) ×2 IMPLANT
DRAPE U-SHAPE 47X51 STRL (DRAPES) ×6 IMPLANT
DRSG AQUACEL AG ADV 3.5X10 (GAUZE/BANDAGES/DRESSINGS) ×2 IMPLANT
DURAPREP 26ML APPLICATOR (WOUND CARE) ×2 IMPLANT
ELECT BLADE TIP CTD 4 INCH (ELECTRODE) ×2 IMPLANT
ELECT PENCIL ROCKER SW 15FT (MISCELLANEOUS) IMPLANT
ELECT REM PT RETURN 15FT ADLT (MISCELLANEOUS) IMPLANT
ELECT REM PT RETURN 9FT ADLT (ELECTROSURGICAL) ×2
ELECTRODE REM PT RTRN 9FT ADLT (ELECTROSURGICAL) ×1 IMPLANT
FACESHIELD WRAPAROUND (MASK) ×8 IMPLANT
FACESHIELD WRAPAROUND OR TEAM (MASK) ×4 IMPLANT
GLOVE BIOGEL PI IND STRL 7.5 (GLOVE) ×1 IMPLANT
GLOVE BIOGEL PI IND STRL 8.5 (GLOVE) ×1 IMPLANT
GLOVE BIOGEL PI INDICATOR 7.5 (GLOVE) ×1
GLOVE BIOGEL PI INDICATOR 8.5 (GLOVE) ×1
GLOVE ECLIPSE 8.0 STRL XLNG CF (GLOVE) ×4 IMPLANT
GLOVE ORTHO TXT STRL SZ7.5 (GLOVE) ×2 IMPLANT
GOWN SPEC L3 XXLG W/TWL (GOWN DISPOSABLE) ×2 IMPLANT
GOWN STRL REUS W/TWL LRG LVL3 (GOWN DISPOSABLE) ×2 IMPLANT
HOLDER FOLEY CATH W/STRAP (MISCELLANEOUS) ×2 IMPLANT
KIT BASIN OR (CUSTOM PROCEDURE TRAY) ×2 IMPLANT
LIQUID BAND (GAUZE/BANDAGES/DRESSINGS) ×2 IMPLANT
NDL SAFETY ECLIPSE 18X1.5 (NEEDLE) IMPLANT
NEEDLE HYPO 18GX1.5 SHARP (NEEDLE)
PACK TOTAL JOINT (CUSTOM PROCEDURE TRAY) ×2 IMPLANT
PEN SKIN MARKING BROAD (MISCELLANEOUS) ×2 IMPLANT
SAW OSC TIP CART 19.5X105X1.3 (SAW) ×2 IMPLANT
SUT MNCRL AB 4-0 PS2 18 (SUTURE) ×2 IMPLANT
SUT VIC AB 1 CT1 36 (SUTURE) ×6 IMPLANT
SUT VIC AB 2-0 CT1 27 (SUTURE) ×4
SUT VIC AB 2-0 CT1 TAPERPNT 27 (SUTURE) ×2 IMPLANT
SUT VLOC 180 0 24IN GS25 (SUTURE) ×2 IMPLANT
SYR 50ML LL SCALE MARK (SYRINGE) IMPLANT
TOWEL OR 17X26 10 PK STRL BLUE (TOWEL DISPOSABLE) ×2 IMPLANT
TOWEL OR NON WOVEN STRL DISP B (DISPOSABLE) IMPLANT
TRAY FOLEY W/METER SILVER 14FR (SET/KITS/TRAYS/PACK) ×2 IMPLANT
WATER STERILE IRR 1500ML POUR (IV SOLUTION) ×2 IMPLANT
YANKAUER SUCT BULB TIP 10FT TU (MISCELLANEOUS) ×2 IMPLANT

## 2014-11-01 NOTE — Anesthesia Procedure Notes (Signed)
Procedure Name: Intubation Date/Time: 11/01/2014 7:12 AM Performed by: Lind Covert Pre-anesthesia Checklist: Patient identified, Emergency Drugs available, Suction available, Patient being monitored and Timeout performed Patient Re-evaluated:Patient Re-evaluated prior to inductionOxygen Delivery Method: Circle system utilized Preoxygenation: Pre-oxygenation with 100% oxygen Intubation Type: IV induction Ventilation: Mask ventilation without difficulty Laryngoscope Size: Mac and 3 Grade View: Grade I Tube type: Oral Tube size: 7.0 mm Number of attempts: 1 (Pt intubated by EMT student Alphia Kava) Airway Equipment and Method: Stylet Placement Confirmation: ETT inserted through vocal cords under direct vision,  breath sounds checked- equal and bilateral and positive ETCO2 Secured at: 20 cm Tube secured with: Tape Dental Injury: Teeth and Oropharynx as per pre-operative assessment

## 2014-11-01 NOTE — Evaluation (Signed)
Physical Therapy Evaluation Patient Details Name: Gabriella Jackson MRN: [id number] DOB: [birth year] Today's Date: 11/01/2014   History of Present Illness  R DA-THA  Clinical Impression  Pt is s/p THA resulting in the deficits listed below (see PT Problem List). Mod assist for sit to stand, pt walked 4' forwards and 4' backwards with RW and min A. Good progress expected.  Pt will benefit from skilled PT to increase their independence and safety with mobility to allow discharge to the venue listed below.      Follow Up Recommendations SNF    Equipment Recommendations  Rolling walker with 5" wheels    Recommendations for Other Services OT consult     Precautions / Restrictions Precautions Precautions: Fall Restrictions Weight Bearing Restrictions: No Other Position/Activity Restrictions: WBAT RLE      Mobility  Bed Mobility Overal bed mobility: Needs Assistance Bed Mobility: Supine to Sit     Supine to sit: Mod assist     General bed mobility comments: mod A to raise trunk, cues for technique  Transfers Overall transfer level: Needs assistance Equipment used: Rolling walker (2 wheeled) Transfers: Sit to/from Stand Sit to Stand: Mod assist         General transfer comment: assist to rise, verbal cues hand placment  Ambulation/Gait Ambulation/Gait assistance: Min assist Ambulation Distance (Feet): 8 Feet Assistive device: Rolling walker (2 wheeled) Gait Pattern/deviations: Step-to pattern;Trunk flexed   Gait velocity interpretation: Below normal speed for age/gender General Gait Details: verbal cues for sequencing and to lift head, distance limited by pain; SaO2 90% on RA, applied 3L O2 [place] which brought sats up to 96%  Stairs            Wheelchair Mobility    Modified Rankin (Stroke Patients Only)       Balance Overall balance assessment: Needs assistance   Sitting balance-Leahy Scale: Good       Standing balance-Leahy Scale: Poor Standing  balance comment: needs BUE support due to pain with WB RLE                             Pertinent Vitals/Pain Pain Assessment: 0-10 Pain Score: 4  Pain Location: R hip Pain Descriptors / Indicators: Sore Pain Intervention(s): Limited activity within patient's tolerance;Monitored during session;Premedicated before session;Ice applied    Home Living Family/patient expects to be discharged to:: Skilled nursing facility Living Arrangements: Alone Available Help at Discharge: Family Type of Home: House Home Access: Stairs to enter Entrance Stairs-Rails: Right Entrance Stairs-Number of Steps: 3 Home Layout: One level Home Equipment: Cane - single point;Grab bars - tub/shower      Prior Function Level of Independence: Independent with assistive device(s)         Comments: walked with cane, independent with ADLs and driving     Hand Dominance        Extremity/Trunk Assessment   Upper Extremity Assessment: Overall WFL for tasks assessed           Lower Extremity Assessment: RLE deficits/detail RLE Deficits / Details: R hip +2/5, AAROM WFL, knee ext at least 3/5, ankle WNL    Cervical / Trunk Assessment: Normal  Communication   Communication: HOH  Cognition Arousal/Alertness: Awake/alert Behavior During Therapy: WFL for tasks assessed/performed Overall Cognitive Status: Within Functional Limits for tasks assessed                      General Comments  Exercises Total Joint Exercises Ankle Circles/Pumps: AROM;Both;10 reps;Supine Heel Slides: AAROM;Right;10 reps;Supine Hip ABduction/ADduction: AAROM;Right;10 reps;Supine Long Arc Quad: AROM;Right;5 reps;Seated      Assessment/Plan    PT Assessment Patient needs continued PT services  PT Diagnosis Difficulty walking;Acute pain   PT Problem List Decreased strength;Decreased activity tolerance;Decreased balance;Pain;Decreased knowledge of use of DME;Decreased mobility;Decreased safety  awareness  PT Treatment Interventions DME instruction;Gait training;Functional mobility training;Therapeutic activities;Patient/family education;Therapeutic exercise   PT Goals (Current goals can be found in the Care Plan section) Acute Rehab PT Goals Patient Stated Goal: to go to the gym PT Goal Formulation: With patient Time For Goal Achievement: 11/15/14 Potential to Achieve Goals: Good    Frequency 7X/week   Barriers to discharge        Co-evaluation               End of Session Equipment Utilized During Treatment: Gait belt Activity Tolerance: Patient tolerated treatment well Patient left: in chair;with call bell/phone within reach Nurse Communication: Mobility status         Time: 1349-1419 PT Time Calculation (min) (ACUTE ONLY): 30 min   Charges:   PT Evaluation $Initial PT Evaluation Tier I: 1 Procedure PT Treatments $Gait Training: 8-22 mins   PT G Codes:        Philomena Doheny 11/01/2014, 2:34 PM [phone]

## 2014-11-01 NOTE — Interval H&P Note (Signed)
History and Physical Interval Note:  11/01/2014 6:56 AM  Gabriella Jackson  has presented today for surgery, with the diagnosis of right hip osteoarthritis  The various methods of treatment have been discussed with the patient and family. After consideration of risks, benefits and other options for treatment, the patient has consented to  Procedure(s): RIGHT TOTAL HIP ARTHROPLASTY ANTERIOR APPROACH (Right) as a surgical intervention .  The patient's history has been reviewed, patient examined, no change in status, stable for surgery.  I have reviewed the patient's chart and labs.  Questions were answered to the patient's satisfaction.     Mauri Pole

## 2014-11-01 NOTE — Op Note (Signed)
NAME:  Gabriella Jackson NO.: [id number]      MEDICAL RECORD NO.: [id number]      FACILITY:  [facility]      PHYSICIAN:  Paralee Cancel D  DATE OF BIRTH:  [birth year]     DATE OF PROCEDURE:  11/01/2014                                 OPERATIVE REPORT         PREOPERATIVE DIAGNOSIS: Right  hip osteoarthritis.      POSTOPERATIVE DIAGNOSIS:  Right hip osteoarthritis.      PROCEDURE:  Right total hip replacement through an anterior approach   utilizing DePuy THR system, component size 41mm pinnacle cup, a size 32+4 neutral   Altrex liner, a size 1 Hi Tri Lock stem with a 32+1 Articuleze metal head ball.      SURGEON:  Pietro Cassis. Alvan Dame, M.D.      ASSISTANT:  Danae Orleans, PA-C     ANESTHESIA:  General.      SPECIMENS:  None.      COMPLICATIONS:  None.      BLOOD LOSS:  350 cc     DRAINS:  None.      INDICATION OF THE PROCEDURE:  Gabriella Jackson is a 81 y.o. female who had   presented to office for evaluation of right hip pain.  Radiographs revealed   progressive degenerative changes with bone-on-bone   articulation to the  hip joint.  The patient had painful limited range of   motion significantly affecting their overall quality of life.  The patient was failing to    respond to conservative measures, and at this point was ready   to proceed with more definitive measures.  The patient has noted progressive   degenerative changes in his hip, progressive problems and dysfunction   with regarding the hip prior to surgery.  Consent was obtained for   benefit of pain relief.  Specific risk of infection, DVT, component   failure, dislocation, need for revision surgery, as well discussion of   the anterior versus posterior approach were reviewed.  Consent was   obtained for benefit of anterior pain relief through an anterior   approach.      PROCEDURE IN DETAIL:  The patient was brought to operative theater.   Once adequate  anesthesia, preoperative antibiotics, 2gm of Ancef, 1gm of Tranexamic Acid, and 10mg  of Decadron administered.   The patient was positioned supine on the OSI Hanna table.  Once adequate   padding of boney process was carried out, we had predraped out the hip, and  used fluoroscopy to confirm orientation of the pelvis and position.      The right hip was then prepped and draped from proximal iliac crest to   mid thigh with shower curtain technique.      Time-out was performed identifying the patient, planned procedure, and   extremity.     An incision was then made 2 cm distal and lateral to the   anterior superior iliac spine extending over the orientation of the   tensor fascia lata muscle and sharp dissection was carried down to the   fascia of the muscle and protractor placed in the soft tissues.      The fascia was then incised.  The muscle belly was identified and swept   laterally and retractor placed along the superior neck.  Following   cauterization of the circumflex vessels and removing some pericapsular   fat, a second cobra retractor was placed on the inferior neck.  A third   retractor was placed on the anterior acetabulum after elevating the   anterior rectus.  A L-capsulotomy was along the line of the   superior neck to the trochanteric fossa, then extended proximally and   distally.  Tag sutures were placed and the retractors were then placed   intracapsular.  We then identified the trochanteric fossa and   orientation of my neck cut, confirmed this radiographically   and then made a neck osteotomy with the femur on traction.  The femoral   head was removed without difficulty or complication.  Traction was let   off and retractors were placed posterior and anterior around the   acetabulum.      The labrum and foveal tissue were debrided.  I began reaming with a 20mm   reamer and reamed up to 44mm reamer with good bony bed preparation and a 13mm   cup was chosen.  The  final 41mm Pinnacle cup was then impacted under fluoroscopy  to confirm the depth of penetration and orientation with respect to   abduction.  A screw was placed followed by the hole eliminator.  The final   32+4 neutral Altrex liner was impacted with good visualized rim fit.  The cup was positioned anatomically within the acetabular portion of the pelvis.      At this point, the femur was rolled at 80 degrees.  Further capsule was   released off the inferior aspect of the femoral neck.  I then   released the superior capsule proximally.  The hook was placed laterally   along the femur and elevated manually and held in position with the bed   hook.  The leg was then extended and adducted with the leg rolled to 100   degrees of external rotation.  Once the proximal femur was fully   exposed, I used a box osteotome to set orientation.  I then began   broaching with the starting chili pepper broach and passed this by hand and then broached up to 1.  With the 1 broach in place I chose a high offset neck and did several trial reductions.  The offset was appropriate, leg lengths   appeared to be equal best with the +1 head ball, confirmed radiographically.     Given these findings, I went ahead and dislocated the hip, repositioned all   retractors and positioned the right hip in the extended and abducted position.  The final 1 Hi Tri Lock stem was   chosen and it was impacted down to the level of neck cut.  Based on this   and the trial reduction, a 32+1 Articuleze metal head head ball was chosen and   impacted onto a clean and dry trunnion, and the hip was reduced.  The   hip had been irrigated throughout the case again at this point.  I did   reapproximate the superior capsular leaflet to the anterior leaflet   using #1 Vicryl.  The fascia of the   tensor fascia lata muscle was then reapproximated using #1 Vicryl and #0 V-lock sutures.  The   remaining wound was closed with 2-0 Vicryl and running  4-0 Monocryl.   The hip was cleaned, dried, and  dressed sterilely using Dermabond and   Aquacel dressing.  She was then brought   to recovery room in stable condition tolerating the procedure well.    Danae Orleans, PA-C was present for the entirety of the case involved from   preoperative positioning, perioperative retractor management, general   facilitation of the case, as well as primary wound closure as assistant.            Pietro Cassis Alvan Dame, M.D.        11/01/2014 8:34 AM

## 2014-11-01 NOTE — Progress Notes (Signed)
Utilization review completed.  

## 2014-11-01 NOTE — Anesthesia Postprocedure Evaluation (Signed)
  Anesthesia Post-op Note  Patient: Gabriella Jackson  Procedure(s) Performed: Procedure(s): RIGHT TOTAL HIP ARTHROPLASTY ANTERIOR APPROACH (Right)  Patient Location: PACU  Anesthesia Type:General  Level of Consciousness: awake, alert , oriented and patient cooperative  Airway and Oxygen Therapy: Patient Spontanous Breathing and Patient connected to nasal cannula oxygen  Post-op Pain: mild  Post-op Assessment: Post-op Vital signs reviewed, Patient's Cardiovascular Status Stable, Respiratory Function Stable, Patent Airway, No signs of Nausea or vomiting and Pain level controlled              Post-op Vital Signs: Reviewed and stable  Last Vitals:  Filed Vitals:   11/01/14 1030  BP: 127/80  Pulse: 79  Temp: 36.5 C  Resp: 20    Complications: No apparent anesthesia complications

## 2014-11-01 NOTE — Anesthesia Preprocedure Evaluation (Addendum)
Anesthesia Evaluation  Patient identified by MRN, date of birth, ID band Patient awake    Reviewed: Allergy & Precautions, NPO status , Patient's Chart, lab work & pertinent test results  History of Anesthesia Complications (+) PONV and history of anesthetic complications  Airway Mallampati: II  TM Distance: >3 FB Neck ROM: Full    Dental  (+) Teeth Intact, Dental Advisory Given   Pulmonary neg pulmonary ROS,  breath sounds clear to auscultation        Cardiovascular hypertension, Pt. on medications - angina+ dysrhythmias Supra Ventricular Tachycardia Rhythm:Regular Rate:Normal  '14 stress test: low risk '14 ECHO: EF 60%, valves OK   Neuro/Psych negative neurological ROS  negative psych ROS   GI/Hepatic negative GI ROS, Neg liver ROS,   Endo/Other  negative endocrine ROS  Renal/GU negative Renal ROS     Musculoskeletal  (+) Arthritis -, Osteoarthritis,    Abdominal   Peds  Hematology  (+) Blood dyscrasia (h/o ITP), ,   Anesthesia Other Findings   Reproductive/Obstetrics                           Anesthesia Physical Anesthesia Plan  ASA: III  Anesthesia Plan: General   Post-op Pain Management:    Induction: Intravenous  Airway Management Planned: LMA  Additional Equipment:   Intra-op Plan:   Post-operative Plan:   Informed Consent: I have reviewed the patients History and Physical, chart, labs and discussed the procedure including the risks, benefits and alternatives for the proposed anesthesia with the patient or authorized representative who has indicated his/her understanding and acceptance.   Dental advisory given  Plan Discussed with: CRNA and Surgeon  Anesthesia Plan Comments: (Plan routine monitors, GA)        Anesthesia Quick Evaluation

## 2014-11-01 NOTE — Progress Notes (Signed)
PACU note-----on arrival to pt's room, pt c/o "choking", had difficulty swallowing and coughing, short of breath,  SAO2 80's; Head of bed elevated, pt encouraged to relax and swallow, O2 switched to humidified, pt then able to cough and swallow and breathe without difficulty; SAO2 up to 97; Dr. Glennon Mac, anesthes, made aware and pt will continue to be monitored in inpatient room; pt's nurse, Gonzella Lex, RN at bedside

## 2014-11-01 NOTE — Transfer of Care (Signed)
Immediate Anesthesia Transfer of Care Note  Patient: Gabriella Jackson  Procedure(s) Performed: Procedure(s): RIGHT TOTAL HIP ARTHROPLASTY ANTERIOR APPROACH (Right)  Patient Location: PACU  Anesthesia Type:General  Level of Consciousness: sedated  Airway & Oxygen Therapy: Patient Spontanous Breathing and Patient connected to face mask oxygen  Post-op Assessment: Report given to RN and Post -op Vital signs reviewed and stable  Post vital signs: Reviewed and stable  Last Vitals:  Filed Vitals:   11/01/14 0525  BP: 123/73  Pulse: 102  Temp: 36.6 C  Resp: 16    Complications: No apparent anesthesia complications

## 2014-11-02 ENCOUNTER — Encounter (HOSPITAL_COMMUNITY): Payer: Self-pay | Admitting: Orthopedic Surgery

## 2014-11-02 LAB — BASIC METABOLIC PANEL
Anion gap: 6 (ref 5–15)
BUN: 11 mg/dL (ref 6–20)
CALCIUM: 8.6 mg/dL — AB (ref 8.9–10.3)
CO2: 28 mmol/L (ref 22–32)
Chloride: 105 mmol/L (ref 101–111)
Creatinine, Ser: 0.68 mg/dL (ref 0.44–1.00)
GFR calc Af Amer: >60 mL/min (ref >60)
GFR calc non Af Amer: >60 mL/min (ref >60)
Glucose, Bld: 149 mg/dL — ABNORMAL HIGH (ref 65–99)
Potassium: 5.2 mmol/L — ABNORMAL HIGH (ref 3.5–5.1)
Sodium: 139 mmol/L (ref 135–145)

## 2014-11-02 LAB — CBC
HEMATOCRIT: 35.1 % — AB (ref 36.0–46.0)
HEMOGLOBIN: 11.4 g/dL — AB (ref 12.0–15.0)
MCH: 31.9 pg (ref 26.0–34.0)
MCHC: 32.5 g/dL (ref 30.0–36.0)
MCV: 98.3 fL (ref 78.0–100.0)
PLATELETS: 215 10*3/uL (ref 150–400)
RBC: 3.57 MIL/uL — ABNORMAL LOW (ref 3.87–5.11)
RDW: 13.2 % (ref 11.5–15.5)
WBC: 13.3 10*3/uL — ABNORMAL HIGH (ref 4.0–10.5)

## 2014-11-02 NOTE — Clinical Social Work Note (Signed)
Clinical Social Work Assessment  Patient Details  Name: Gabriella Jackson MRN: [id number] Date of Birth: 03-Jul-1924  Date of referral:  11/02/14               Reason for consult:  Facility Placement, Discharge Planning                Permission sought to share information with:    Permission granted to share information::     Name::        Agency::     Relationship::     Contact Information:     Housing/Transportation Living arrangements for the past 2 months:  Single Family Home Source of Information:  Patient, Adult Children Patient Interpreter Needed:  None Criminal Activity/Legal Involvement Pertinent to Current Situation/Hospitalization:  No - Comment as needed Significant Relationships:  Adult Children Lives with:  Self Do you feel safe going back to the place where you live?   (ST Rehab needed.) Need for family participation in patient care:  Yes (Comment)  Care giving concerns:  Pt's care cannot be managed at home following hospital d/c.    Social Worker assessment / plan:  Pt hospitalized on 11/01/14 for pre planned right total hip arthroplasty. CSW met with pt / daughter to assist with d/c planning. Pt has made prior arrangements to have ST Rehab at [facility] at d/c. CSW has contacted SNF and d/c plans have been confirmed. Pt has [employer] which requires prior authorization. CSW has contacted insurance, clinicals have been sent for review and authorization for ST Rehab has been provided. CSW has requested authorization for PTAR transport, as well.   Employment status:  Retired Nurse, adult PT Recommendations:  [place] / Referral to community resources:  [place]  Patient/Family's Response to care:  Pt / daughter feel ST Rehab is needed.  Patient/Family's Understanding of and Emotional Response to Diagnosis, Current Treatment, and Prognosis:  Pt is HOH and did not have hearing  aids during  CSW visit. Pt was very pleasant but frustrated with her difficulty hearing the conversation. Pt understands the need to work with therapy but admits to being a little anxious about the upcoming session. Reassurance and support provided.  Emotional Assessment Appearance:  Appears stated age Attitude/Demeanor/Rapport:  Other (cooperative) Affect (typically observed):  Pleasant, Frustrated Orientation:  Oriented to Self, Oriented to Place, Oriented to  Time, Oriented to Situation Alcohol / Substance use:  Alcohol Use Psych involvement (Current and /or in the community):  No (Comment)  Discharge Needs  Concerns to be addressed:  Discharge Planning Concerns Readmission within the last 30 days:  No Current discharge risk:  None Barriers to Discharge:  No Barriers Identified   Loraine Maple [phone] 11/02/2014, 3:18 PM

## 2014-11-02 NOTE — Care Management Note (Signed)
Case Management Note  Patient Details  Name: THECLA FORGIONE MRN: [id number] Date of Birth: Sep 22, 1924  Subjective/Objective:                   RIGHT TOTAL HIP ARTHROPLASTY ANTERIOR APPROACH (Right Action/Plan: Discharge planning Expected Discharge Date:  11/03/14               Expected Discharge Plan:  [place]  In-House Referral:     Discharge planning Services  CM Consult  Post Acute Care Choice:    Choice offered to:     DME Arranged:    DME Agency:     HH Arranged:    [place] Agency:     Status of Service:  Completed, signed off  Medicare Important Message Given:    Date Medicare IM Given:    Medicare IM give by:    Date Additional Medicare IM Given:    Additional Medicare Important Message give by:     If discussed at [place] of Stay Meetings, dates discussed:    Additional Comments: CM notes pt to go to SNF; CSW arranging.  No other CM needs were communicated. Dellie Catholic, RN 11/02/2014, 11:17 AM

## 2014-11-02 NOTE — Progress Notes (Signed)
     Subjective: 1 Day Post-Op Procedure(s) (LRB): RIGHT TOTAL HIP ARTHROPLASTY ANTERIOR APPROACH (Right)   Patient reports pain as mild, pain controlled. No events throughout the night. Already had a BM this morning. Planning on SNF upon d/c.  Objective:   VITALS:   Filed Vitals:   11/02/14 0531  BP: 118/59  Pulse: 80  Temp: 98 F (36.7 C)  Resp: 16    Dorsiflexion/Plantar flexion intact Incision: dressing C/D/I No cellulitis present Compartment soft  LABS  Recent Labs  11/02/14 0454  HGB 11.4*  HCT 35.1*  WBC 13.3*  PLT 215     Recent Labs  11/02/14 0454  NA 139  K 5.2*  BUN 11  CREATININE 0.68  GLUCOSE 149*     Assessment/Plan: 1 Day Post-Op Procedure(s) (LRB): RIGHT TOTAL HIP ARTHROPLASTY ANTERIOR APPROACH (Right) Foley cath d/c'ed Advance diet Up with therapy D/C IV fluids Discharge to SNF eventually, when ready        [name]. Boyde Grieco   PAC  11/02/2014, 9:34 AM

## 2014-11-02 NOTE — Progress Notes (Signed)
Physical Therapy Treatment Patient Details Name: Gabriella Jackson MRN: [id number] DOB: 1924-10-20 Today's Date: 11/02/2014    History of Present Illness R DA-THA    PT Comments    POD # 1 pm session.  Pt demonstrating more anxiety/nervousness.  Required extra time and VC's for safety. Assisted with amb in hallway a limited distance.  Very unsteady gait.  HIGH FALL RISK.   Pt will need ST Rehab at SNF prior to returning home alone.   Follow Up Recommendations  SNF     Equipment Recommendations       Recommendations for Other Services       Precautions / Restrictions      Mobility  Bed Mobility               General bed mobility comments: Pt OOB in recliner  Transfers Overall transfer level: Needs assistance Equipment used: Rolling walker (2 wheeled) Transfers: Sit to/from Stand Sit to Stand: Min assist         General transfer comment: 75% VC's on safety and proper tech.  Pt impulsive/unsteady  Ambulation/Gait Ambulation/Gait assistance: Min assist Ambulation Distance (Feet): 26 Feet Assistive device: Rolling walker (2 wheeled) Gait Pattern/deviations: Step-to pattern;Trunk flexed;Decreased step length - right;Decreased step length - left;Decreased stance time - left     General Gait Details: 75% VC's on safety and proper walker to self distance.  Unsteady gait.  Impaired safety cognition.  HIGH FALL RISK   Stairs            Wheelchair Mobility    Modified Rankin (Stroke Patients Only)       Balance                                    Cognition Arousal/Alertness: Awake/alert Behavior During Therapy: Restless;Anxious                        Exercises  10 reps LAQ's 10 reps knee presses 10 reps HS AAROM    General Comments        Pertinent Vitals/Pain Pain Assessment: Faces Faces Pain Scale: Hurts little more Pain Location: R hip Pain Descriptors / Indicators: Aching;Sore Pain Intervention(s): Monitored  during session;Repositioned;Ice applied    Home Living                      Prior Function            PT Goals (current goals can now be found in the care plan section) Progress towards PT goals: Progressing toward goals    Frequency  7X/week    PT Plan Current plan remains appropriate    Co-evaluation             End of Session Equipment Utilized During Treatment: Gait belt Activity Tolerance: Patient tolerated treatment well Patient left: in chair;with call bell/phone within reach;with chair alarm set     Time: 1355-1420 PT Time Calculation (min) (ACUTE ONLY): 25 min  Charges:  $Gait Training: 8-22 mins $Therapeutic Exercise: 8-22 mins                     G Codes:      Rica Koyanagi  PTA [facility]  Acute  Rehab Pager      [phone]

## 2014-11-02 NOTE — Evaluation (Signed)
Occupational Therapy Evaluation Patient Details Name: Gabriella Jackson MRN: [id number] DOB: 11-21-1924 Today's Date: 11/02/2014    History of Present Illness R DA-THA   Clinical Impression   This 81 year old female was admitted for the above surgery. She will benefit from continued OT to increase safety and independence with adls.  Pt was mod I prior to admission and needs up to max A for LB adls.  Goals in acute are set for min guard level for bathing, grooming, and toileting    Follow Up Recommendations  SNF    Equipment Recommendations  3 in 1 bedside comode    Recommendations for Other Services       Precautions / Restrictions Precautions Precautions: Fall      Mobility Bed Mobility   Bed Mobility: Supine to Sit     Supine to sit: Min assist     General bed mobility comments: assist for RLE, HOB raised  Transfers   Equipment used: Rolling walker (2 wheeled) Transfers: Sit to/from Stand Sit to Stand: Min assist         General transfer comment: assist to rise and steady.  Cues for UE/LE placement    Balance                                            ADL Overall ADL's : Needs assistance/impaired     Grooming: Oral care;Set up;Sitting   Upper Body Bathing: Set up;Sitting   Lower Body Bathing: Moderate assistance;Sit to/from stand   Upper Body Dressing : Set up;Sitting   Lower Body Dressing: Maximal assistance;Sit to/from stand                 General ADL Comments: performed ADl from EOB and walked around bed to recliner.  Reviewed working within pain tolerance during adls.  Donned underwear and pad.  Multiple cues for safety--pt very [facility]     Vision     Perception     Praxis      Pertinent Vitals/Pain Pain Assessment: Faces Faces Pain Scale: Hurts even more Pain Location: R hip/thigh Pain Descriptors / Indicators: Aching (brief) Pain Intervention(s): Limited activity within patient's tolerance;Monitored  during session;Premedicated before session;Repositioned;Ice applied     Hand Dominance     Extremity/Trunk Assessment Upper Extremity Assessment Upper Extremity Assessment: Overall WFL for tasks assessed           Communication Communication Communication: HOH (wears hearing aides--still HOH)   Cognition Arousal/Alertness: Awake/alert Behavior During Therapy: WFL for tasks assessed/performed Overall Cognitive Status: No family/caregiver present to determine baseline cognitive functioning                 General Comments: HOH, stated she dropped hearing aide, but it was still in her ear. Impulsive at times, multimodal cues--may be due to [place]       Exercises       Shoulder Instructions      Home Living Family/patient expects to be discharged to:: Skilled nursing facility Living Arrangements: Alone                               Additional Comments: pt says she has a tub.  Previous information was walk in shower:  plans rehab      Prior Functioning/Environment Level of Independence: Independent  with assistive device(s)        Comments: walked with cane, independent with ADLs and driving    OT Diagnosis: Generalized weakness;Acute pain   OT Problem List: Decreased strength;Decreased activity tolerance;Decreased safety awareness;Decreased knowledge of use of DME or AE;Pain   OT Treatment/Interventions: Self-care/ADL training;DME and/or AE instruction;Patient/family education;Therapeutic activities    OT Goals(Current goals can be found in the care plan section) Acute Rehab OT Goals Patient Stated Goal: to go to the gym OT Goal Formulation: With patient Time For Goal Achievement: 11/09/14 Potential to Achieve Goals: Good ADL Goals Pt Will Perform Grooming: standing;with min guard assist Pt Will Perform Lower Body Bathing: with min guard assist;with adaptive equipment;sit to/from stand Pt Will Transfer to Toilet: with min  guard assist;ambulating;bedside commode Pt Will Perform Toileting - Clothing Manipulation and hygiene: with min guard assist;sit to/from stand  OT Frequency: Min 2X/week   Barriers to D/C:            Co-evaluation              End of Session    Activity Tolerance: Patient tolerated treatment well Patient left: in chair;with call bell/phone within reach;with chair alarm set   Time: 0312-8118 OT Time Calculation (min): 45 min Charges:  OT General Charges $OT Visit: 1 Procedure OT Evaluation $Initial OT Evaluation Tier I: 1 Procedure OT Treatments $Self Care/Home Management : 8-22 mins $Therapeutic Activity: 8-22 mins G-Codes:    Nicolae Vasek 11-04-14, 9:02 AM  Lesle Chris, OTR/L [phone] 2014-11-04

## 2014-11-02 NOTE — Progress Notes (Signed)
Physical Therapy Treatment Patient Details Name: Gabriella Jackson MRN: [id number] DOB: [birth year] Today's Date: 11/02/2014    History of Present Illness R DA-THA    PT Comments    POD # 1 am session.  Pt OOB in recliner.  Assisted with amb a limited distance.  Pt required MAX VC's for safety and proper walker use.  Very unsteady gait.  HIGH FALL RISK.  Pt will need ST Rehab at SNF prior to returning home alone.   Follow Up Recommendations  SNF     Equipment Recommendations       Recommendations for Other Services       Precautions / Restrictions      Mobility  Bed Mobility               General bed mobility comments: Pt OOB in recliner  Transfers Overall transfer level: Needs assistance Equipment used: Rolling walker (2 wheeled) Transfers: Sit to/from Stand Sit to Stand: Min assist         General transfer comment: 75% VC's on safety and proper tech.  Pt impulsive/unsteady  Ambulation/Gait Ambulation/Gait assistance: Min assist Ambulation Distance (Feet): 22 Feet Assistive device: Rolling walker (2 wheeled) Gait Pattern/deviations: Step-to pattern;Trunk flexed;Decreased step length - right;Decreased step length - left;Decreased stance time - left     General Gait Details: 75% VC's on safety and proper walker to self distance.  Unsteady gait.  Impaired safety cognition.  HIGH FALL RISK   Stairs            Wheelchair Mobility    Modified Rankin (Stroke Patients Only)       Balance                                    Cognition Arousal/Alertness: Awake/alert Behavior During Therapy: Restless;Anxious                        Exercises  10 reps AP 10 reps knee presses 10 reps LAQ's    General Comments        Pertinent Vitals/Pain Pain Assessment: Faces Faces Pain Scale: Hurts little more Pain Location: R hip Pain Descriptors / Indicators: Aching;Sore Pain Intervention(s): Monitored during  session;Repositioned;Ice applied    Home Living                      Prior Function            PT Goals (current goals can now be found in the care plan section) Progress towards PT goals: Progressing toward goals    Frequency  7X/week    PT Plan Current plan remains appropriate    Co-evaluation             End of Session Equipment Utilized During Treatment: Gait belt Activity Tolerance: Patient tolerated treatment well Patient left: in chair;with call bell/phone within reach;with chair alarm set     Time: 1000-1025 PT Time Calculation (min) (ACUTE ONLY): 25 min  Charges:  $Gait Training: 8-22 mins $Therapeutic Activity: 8-22 mins                    G Codes:      Rica Koyanagi  PTA [facility]  Acute  Rehab Pager      [phone]

## 2014-11-02 NOTE — Clinical Social Work Placement (Signed)
   CLINICAL SOCIAL WORK PLACEMENT  NOTE  Date:  11/02/2014  Patient Details  Name: Gabriella Jackson MRN: [id number] Date of Birth: 1924/07/27  Clinical Social Work is seeking post-discharge placement for this patient at the [place] level of care (*CSW will initial, date and re-position this form in  chart as items are completed):  No   Patient/family provided with [place] Work Department's list of facilities offering this level of care within the geographic area requested by the patient (or if unable, by the patient's family).  Yes   Patient/family informed of their freedom to choose among providers that offer the needed level of care, that participate in Medicare, Medicaid or managed care program needed by the patient, have an available bed and are willing to accept the patient.  No   Patient/family informed of [place]'s ownership interest in [facility] and [facility], as well as of the fact that they are under no obligation to receive care at these facilities.  PASRR submitted to EDS on 11/02/14     PASRR number received on 11/02/14     Existing PASRR number confirmed on       FL2 transmitted to all facilities in geographic area requested by pt/family on 11/02/14     FL2 transmitted to all facilities within larger geographic area on       Patient informed that his/her managed care company has contracts with or will negotiate with certain facilities, including the following:        Yes   Patient/family informed of bed offers received.  Patient chooses bed at [facility]     Physician recommends and patient chooses bed at      Patient to be transferred to [facility] on  .  Patient to be transferred to facility by       Patient family notified on   of transfer.  Name of family member notified:        PHYSICIAN       Additional Comment:    _______________________________________________ Luretha Rued, Marlinda Mike   [phone] 11/02/2014, 3:35 PM

## 2014-11-03 LAB — BASIC METABOLIC PANEL
Anion gap: 7 (ref 5–15)
BUN: 12 mg/dL (ref 6–20)
CO2: 27 mmol/L (ref 22–32)
Calcium: 8.4 mg/dL — ABNORMAL LOW (ref 8.9–10.3)
Chloride: 104 mmol/L (ref 101–111)
Creatinine, Ser: 0.66 mg/dL (ref 0.44–1.00)
GFR calc non Af Amer: >60 mL/min (ref >60)
GLUCOSE: 128 mg/dL — AB (ref 65–99)
Potassium: 3.9 mmol/L (ref 3.5–5.1)
Sodium: 138 mmol/L (ref 135–145)

## 2014-11-03 LAB — CBC
HCT: 33.5 % — ABNORMAL LOW (ref 36.0–46.0)
Hemoglobin: 11 g/dL — ABNORMAL LOW (ref 12.0–15.0)
MCH: 32.4 pg (ref 26.0–34.0)
MCHC: 32.8 g/dL (ref 30.0–36.0)
MCV: 98.8 fL (ref 78.0–100.0)
Platelets: 216 10*3/uL (ref 150–400)
RBC: 3.39 MIL/uL — ABNORMAL LOW (ref 3.87–5.11)
RDW: 13.7 % (ref 11.5–15.5)
WBC: 13.9 10*3/uL — ABNORMAL HIGH (ref 4.0–10.5)

## 2014-11-03 MED ORDER — LORAZEPAM 0.5 MG PO TABS: 0.5000 mg | ORAL_TABLET | Freq: Two times a day (BID) | ORAL | Status: DC

## 2014-11-03 MED ORDER — LORAZEPAM 0.5 MG PO TABS: 0.5000 mg | ORAL_TABLET | Freq: Two times a day (BID) | ORAL | Status: DC | PRN

## 2014-11-03 NOTE — Progress Notes (Signed)
Physical Therapy Treatment Patient Details Name: Gabriella Jackson MRN: [id number] DOB: 08/27/1924 Today's Date: 11/03/2014    History of Present Illness R DA-THA    PT Comments    POD # 2 pm session.  Assisted OOB tio amb in hallway then assisted to bathroom.  Pt remains unsteady and requires increased VC's for direction and safety.  Assisted back to bed. Pt will need ST Rehab at SNF prior to returning home.   Follow Up Recommendations  SNF [facility]     Equipment Recommendations       Recommendations for Other Services       Precautions / Restrictions Precautions Precautions: Fall Restrictions Weight Bearing Restrictions: No Other Position/Activity Restrictions: WBAT RLE    Mobility  Bed Mobility               General bed mobility comments: Pt OOB in recliner  Transfers Overall transfer level: Needs assistance Equipment used: Rolling walker (2 wheeled) Transfers: Sit to/from Stand Sit to Stand: Min assist         General transfer comment: 75% VC's on safety and proper tech.  Pt impulsive/unsteady  Ambulation/Gait Ambulation/Gait assistance: Min assist Ambulation Distance (Feet): 35 Feet Assistive device: Rolling walker (2 wheeled)       General Gait Details: 75% VC's on safety and proper walker to self distance.  Unsteady gait.  Impaired safety cognition.  pt let's go of walker and parks it to the side. HIGH FALL RISK   Stairs            Wheelchair Mobility    Modified Rankin (Stroke Patients Only)       Balance                                    Cognition Arousal/Alertness: Awake/alert Behavior During Therapy: Restless;Anxious Overall Cognitive Status: Impaired/Different from baseline Area of Impairment: Orientation;Attention;Safety/judgement;Awareness;Problem solving Orientation Level: Disoriented to;Place;Time;Situation Current Attention Level: Selective     Safety/Judgement: Decreased awareness of safety     General Comments: anxious/restless/forgetful    Exercises      General Comments        Pertinent Vitals/Pain Pain Assessment: Faces Faces Pain Scale: Hurts little more Pain Location: R hip Pain Descriptors / Indicators: Tightness Pain Intervention(s): Monitored during session;Repositioned;Ice applied    Home Living                      Prior Function            PT Goals (current goals can now be found in the care plan section) Progress towards PT goals: Progressing toward goals    Frequency  7X/week    PT Plan Current plan remains appropriate    Co-evaluation             End of Session Equipment Utilized During Treatment: Gait belt Activity Tolerance: Other (comment) (anxiety) Patient left: in chair;with call bell/phone within reach;with chair alarm set     Time: 7124-5809 PT Time Calculation (min) (ACUTE ONLY): 12 min  Charges:  $Gait Training: 8-22 mins                    G Codes:      Rica Koyanagi  PTA [facility]  Acute  Rehab Pager      [phone]

## 2014-11-03 NOTE — Progress Notes (Signed)
Physical Therapy Treatment Patient Details Name: Gabriella Jackson MRN: [id number] DOB: Apr 07, 1925 Today's Date: 11/03/2014    History of Present Illness R DA-THA    PT Comments    POD # 2 am session.  Pt appears even more confused/anxious and restless.  Requires increased time to process directions and increased cueing for safety and current situation.  Assisted with amb pt in hallway then to bathroom.  Very unsteady gait and impaired safety cognition.  Required increased direction on proper use of walker and turn completion prior to sit.  Performed THR TE's then applied ICE. Pt will need ST Rehab at SNF prior to retuning home.  HIGH FALL RISK.  Follow Up Recommendations  SNF [facility])     Equipment Recommendations       Recommendations for Other Services       Precautions / Restrictions Precautions Precautions: Fall Restrictions Weight Bearing Restrictions: No Other Position/Activity Restrictions: WBAT RLE    Mobility  Bed Mobility               General bed mobility comments: Pt OOB in recliner  Transfers Overall transfer level: Needs assistance Equipment used: Rolling walker (2 wheeled) Transfers: Sit to/from Stand Sit to Stand: Min assist         General transfer comment: 75% VC's on safety and proper tech.  Pt impulsive/unsteady  Ambulation/Gait Ambulation/Gait assistance: Min assist Ambulation Distance (Feet): 28 Feet Assistive device: Rolling walker (2 wheeled)       General Gait Details: 75% VC's on safety and proper walker to self distance.  Unsteady gait.  Impaired safety cognition.  pt let's go of walker and parks it to the side. HIGH FALL RISK   Stairs            Wheelchair Mobility    Modified Rankin (Stroke Patients Only)       Balance                                    Cognition Arousal/Alertness: Awake/alert Behavior During Therapy: Restless;Anxious Overall Cognitive Status: Impaired/Different from  baseline Area of Impairment: Orientation;Attention;Safety/judgement;Awareness;Problem solving Orientation Level: Disoriented to;Place;Time;Situation Current Attention Level: Selective     Safety/Judgement: Decreased awareness of safety     General Comments: anxious/restless/forgetful    Exercises   Total Hip Replacement TE's 10 reps ankle pumps 10 reps knee presses 10 reps heel slides 10 reps SAQ's 10 reps ABD Followed by ICE    General Comments        Pertinent Vitals/Pain Pain Assessment: Faces Faces Pain Scale: Hurts little more Pain Location: R hip Pain Descriptors / Indicators: Tightness Pain Intervention(s): Monitored during session;Repositioned;Ice applied    Home Living                      Prior Function            PT Goals (current goals can now be found in the care plan section) Progress towards PT goals: Progressing toward goals    Frequency  7X/week    PT Plan Current plan remains appropriate    Co-evaluation             End of Session Equipment Utilized During Treatment: Gait belt Activity Tolerance: Other (comment) (anxiety) Patient left: in chair;with call bell/phone within reach;with chair alarm set     Time:9:00  - 9:25    Charges:    1  gt  1 ta                   G Codes:      Nathanial Rancher 27-Nov-2014, 12:41 PM

## 2014-11-03 NOTE — Discharge Instructions (Signed)

## 2014-11-04 MED ORDER — ASPIRIN 325 MG PO TBEC: 325.0000 mg | DELAYED_RELEASE_TABLET | Freq: Two times a day (BID) | ORAL | Status: AC

## 2014-11-04 MED ORDER — LORAZEPAM 0.5 MG PO TABS: 0.5000 mg | ORAL_TABLET | Freq: Two times a day (BID) | ORAL | Status: DC | PRN

## 2014-11-04 MED ORDER — POLYETHYLENE GLYCOL 3350 17 G PO PACK: 17.0000 g | PACK | Freq: Two times a day (BID) | ORAL | Status: DC

## 2014-11-04 MED ORDER — DOCUSATE SODIUM 100 MG PO CAPS: 100.0000 mg | ORAL_CAPSULE | Freq: Two times a day (BID) | ORAL | Status: DC

## 2014-11-04 MED ORDER — HYDROCODONE-ACETAMINOPHEN 7.5-325 MG PO TABS: 1.0000 | ORAL_TABLET | ORAL | Status: DC | PRN

## 2014-11-04 MED ORDER — FERROUS SULFATE 325 (65 FE) MG PO TABS: 325.0000 mg | ORAL_TABLET | Freq: Three times a day (TID) | ORAL | Status: DC

## 2014-11-04 MED ORDER — TIZANIDINE HCL 4 MG PO TABS: 4.0000 mg | ORAL_TABLET | Freq: Four times a day (QID) | ORAL | Status: DC | PRN

## 2014-11-04 NOTE — Clinical Social Work Placement (Signed)
   CLINICAL SOCIAL WORK PLACEMENT  NOTE  Date:  11/04/2014  Patient Details  Name: Gabriella Jackson MRN: [id number] Date of Birth: March 16, 1925  Clinical Social Work is seeking post-discharge placement for this patient at the [place] level of care (*CSW will initial, date and re-position this form in  chart as items are completed):  No   Patient/family provided with [place] Work Department's list of facilities offering this level of care within the geographic area requested by the patient (or if unable, by the patient's family).  Yes   Patient/family informed of their freedom to choose among providers that offer the needed level of care, that participate in Medicare, Medicaid or managed care program needed by the patient, have an available bed and are willing to accept the patient.  No   Patient/family informed of [place]'s ownership interest in [facility] and [facility], as well as of the fact that they are under no obligation to receive care at these facilities.  PASRR submitted to EDS on 11/02/14     PASRR number received on 11/02/14     Existing PASRR number confirmed on       FL2 transmitted to all facilities in geographic area requested by pt/family on 11/02/14     FL2 transmitted to all facilities within larger geographic area on       Patient informed that his/her managed care company has contracts with or will negotiate with certain facilities, including the following:        Yes   Patient/family informed of bed offers received.  Patient chooses bed at [facility]     Physician recommends and patient chooses bed at      Patient to be transferred to [facility] on 11/04/14.  Patient to be transferred to facility by [facility]     Patient family notified on 11/04/14 of transfer.  Name of family member notified:  Daughter     PHYSICIAN       Additional Comment: Pt / daughter are in agreement with d/c to [facility]  today. [facility] transport is required. [employer] medicare has provided prior authorization for SNF placement. [facility] authorization has been requested. Pt / daughter are aware out of pocket costs may be associated with [facility] transport. NSG reviewed d/c summary, scripts, avs. Scripts included in dd/c packet. D/C summary sent to SNF  For review prior to d/c.   _______________________________________________ Luretha Rued, LCSW 11/04/2014, 2:09 PM

## 2014-11-04 NOTE — Progress Notes (Signed)
     Subjective: 3 Days Post-Op Procedure(s) (LRB): RIGHT TOTAL HIP ARTHROPLASTY ANTERIOR APPROACH (Right)   Patient reports pain as mild, pain controlled. No events throughout the night.  Ready to be discharged to skilled nursing facility.  Objective:   VITALS:   Filed Vitals:   11/04/14 0600  BP: 152/78  Pulse: 97  Temp: 98.5 F (36.9 C)  Resp: 18    Dorsiflexion/Plantar flexion intact Incision: dressing C/D/I No cellulitis present Compartment soft  LABS  Recent Labs  11/02/14 0454 11/03/14 0440  HGB 11.4* 11.0*  HCT 35.1* 33.5*  WBC 13.3* 13.9*  PLT 215 216     Recent Labs  11/02/14 0454 11/03/14 0440  NA 139 138  K 5.2* 3.9  BUN 11 12  CREATININE 0.68 0.66  GLUCOSE 149* 128*     Assessment/Plan: 3 Days Post-Op Procedure(s) (LRB): RIGHT TOTAL HIP ARTHROPLASTY ANTERIOR APPROACH (Right) Up with therapy Discharge to SNF  Follow up in 2 weeks at [facility]. Follow up with OLIN,Harpreet Signore D in 2 weeks.  Contact information:  [facility] [address], [address] [place]   PAC  11/04/2014, 8:21 AM

## 2014-11-04 NOTE — Care Management Important Message (Signed)
Important Message  Patient Details  Name: Gabriella Jackson MRN: [id number] Date of Birth: [birth year]   Medicare Important Message Given:  Yes-second notification given    Camillo Flaming 11/04/2014, 1:13 [place] Message  Patient Details  Name: Gabriella Jackson MRN: [id number] Date of Birth: 19-Jul-1924   Medicare Important Message Given:  Yes-second notification given    Camillo Flaming 11/04/2014, 1:13 PM

## 2014-11-04 NOTE — Progress Notes (Addendum)
     Subjective: 2 Days Post-Op Procedure(s) (LRB): RIGHT TOTAL HIP ARTHROPLASTY ANTERIOR APPROACH (Right)   Patient reports pain as mild, pain controlled. No events throughout the night. Concern from family that she may drink regularly and is getting anxious.   Objective:   VITALS:   Filed Vitals:   11/03/14  0644  BP: 144/65  Pulse: 100  Temp: 99 F (37.2 C)  Resp: 18    Dorsiflexion/Plantar flexion intact Incision: dressing C/D/I No cellulitis present Compartment soft  LABS  Recent Labs  11/02/14 0454 11/03/14 0440  HGB 11.4* 11.0*  HCT 35.1* 33.5*  WBC 13.3* 13.9*  PLT 215 216     Recent Labs  11/02/14 0454 11/03/14 0440  NA 139 138  K 5.2* 3.9  BUN 11 12  CREATININE 0.68 0.66  GLUCOSE 149* 128*     Assessment/Plan: 2 Days Post-Op Procedure(s) (LRB): RIGHT TOTAL HIP ARTHROPLASTY ANTERIOR APPROACH (Right) Ativan ordered. Up with therapy Discharge to SNF      Gabriella Jackson. Gabriella Jackson   PAC  11/04/2014, 8:28 AM

## 2014-11-04 NOTE — Plan of Care (Signed)
Problem: Discharge Progression Outcomes Goal: Anticoagulant follow-up in place Outcome: Not Applicable Date Met:  37/29/02 ASA VTE, no f/u needed.

## 2014-11-04 NOTE — Discharge Summary (Signed)
Physician Discharge Summary  Patient ID: Gabriella Jackson MRN: [id number] DOB/AGE: October 07, 1924 81 y.o.  Admit date: 11/01/2014 Discharge date:  11/04/2014  Procedures:  Procedure(s) (LRB): RIGHT TOTAL HIP ARTHROPLASTY ANTERIOR APPROACH (Right)  Attending Physician:  Dr. Paralee Cancel   Admission Diagnoses:   Right hip primary OA / pain  Discharge Diagnoses:  Active Problems:   S/P right THA, AA  Past Medical History  Diagnosis Date  . Fuchs' corneal dystrophy 2002  . ITP (idiopathic thrombocytopenic purpura)     s/p splenectomy- remission  . SVT (supraventricular tachycardia)   . Urticaria   . Depressed   . DJD (degenerative joint disease)   . Osteopenia   . Dysrhythmia     hx SVT  . PONV (postoperative nausea and vomiting)   . Colon cancer 2002    s/p colonic resection- chemotherapy and surgery  . Hernia, inguinal, right     HPI:    Gabriella Jackson, 81 y.o. female, has a history of pain and functional disability in the right hip(s) due to arthritis and patient has failed non-surgical conservative treatments for greater than 12 weeks to include NSAID's and/or analgesics, corticosteriod injections, use of assistive devices and activity modification. Onset of symptoms was gradual starting 4-5 years ago with gradually worsening course since that time.The patient noted no past surgery on the right hip(s). Patient currently rates pain in the right hip at 9 out of 10 with activity. Patient has worsening of pain with activity and weight bearing, trendelenberg gait, pain that interfers with activities of daily living and pain with passive range of motion. Patient has evidence of periarticular osteophytes and joint space narrowing by imaging studies. This condition presents safety issues increasing the risk of falls. There is no current active infection. Risks, benefits and expectations were discussed with the patient. Risks including but not limited to the risk of anesthesia,  blood clots, nerve damage, blood vessel damage, failure of the prosthesis, infection and up to and including death. Patient understand the risks, benefits and expectations and wishes to proceed with surgery.   PCP: Mathews Argyle, MD   Discharged Condition: good  Hospital Course:  Patient underwent the above stated procedure on 11/01/2014. Patient tolerated the procedure well and brought to the recovery room in good condition and subsequently to the floor.  POD #1 BP: 118/59 ; Pulse: 80 ; Temp: 98 F (36.7 C) ; Resp: 16 Patient reports pain as mild, pain controlled. No events throughout the night. Already had a BM this morning. Planning on SNF upon d/c. Dorsiflexion/plantar flexion intact, incision: dressing C/D/I, no cellulitis present and compartment soft.   LABS  Basename    HGB  11.4  HCT  35.1   POD #2  BP: 144/65 ; Pulse: 100 ; Temp: 99 F (37.2 C) ; Resp: 18 Patient reports pain as mild, pain controlled. No events throughout the night. Concern from family that she may drink regularly and patient getting a little anxious.  Ativan added to orders.  Dorsiflexion/plantar flexion intact, incision: dressing C/D/I, no cellulitis present and compartment soft.   LABS  Basename    HGB  11.0  HCT  33.5   POD #3  BP: 152/78 ; Pulse: 97 ; Temp: 98.5 F (36.9 C) ; Resp: 18 Patient reports pain as mild, pain controlled. No events throughout the night. Ready to be discharged to skilled nursing facility. Dorsiflexion/plantar flexion intact, incision: dressing C/D/I, no cellulitis present and compartment soft.   LABS   No  new labs   Discharge Exam: General appearance: alert, cooperative and no distress Extremities: Homans sign is negative, no sign of DVT, no edema, redness or tenderness in the calves or thighs and no ulcers, gangrene or trophic changes  Disposition:    Skilled nursing facility with follow up in 2 weeks   Follow-up Information    Follow up with Mauri Pole,  MD. Schedule an appointment as soon as possible for a visit in 2 weeks.   Specialty:  Orthopedic Surgery   Contact information:   [address] [place] [address] [phone]       Discharge Instructions    Call MD / Call 911    Complete by:  As directed   If you experience chest pain or shortness of breath, CALL 911 and be transported to the hospital emergency room.  If you develope a fever above 101 F, pus (white drainage) or increased drainage or redness at the wound, or calf pain, call your surgeon's office.     Change dressing    Complete by:  As directed   Maintain surgical dressing until follow up in the clinic. If the edges start to pull up, may reinforce with tape. If the dressing is no longer working, may remove and cover with gauze and tape, but must keep the area dry and clean.  Call with any questions or concerns.     Constipation Prevention    Complete by:  As directed   Drink plenty of fluids.  Prune juice may be helpful.  You may use a stool softener, such as Colace (over the counter) 100 mg twice a day.  Use MiraLax (over the counter) for constipation as needed.     Diet - low sodium heart healthy    Complete by:  As directed      Discharge instructions    Complete by:  As directed   Maintain surgical dressing until follow up in the clinic. If the edges start to pull up, may reinforce with tape. If the dressing is no longer working, may remove and cover with gauze and tape, but must keep the area dry and clean.  Follow up in 2 weeks at [facility]. Call with any questions or concerns.     Increase activity slowly as tolerated    Complete by:  As directed      TED hose    Complete by:  As directed   Use stockings (TED hose) for 2 weeks on both leg(s).  You may remove them at night for sleeping.     Weight bearing as tolerated    Complete by:  As directed   Laterality:  right  Extremity:  Lower             Medication List      STOP taking these medications        acetaminophen 650 MG CR tablet  Commonly known as:  TYLENOL      TAKE these medications        aspirin 325 MG EC tablet  Take 1 tablet (325 mg total) by mouth 2 (two) times daily.     citalopram 20 MG tablet  Commonly known as:  CELEXA  Take 20 mg by mouth every morning.     diltiazem 120 MG 24 hr capsule  Commonly known as:  CARDIZEM CD  TAKE 1 CAPSULE (120 MG TOTAL) BY MOUTH DAILY.     docusate sodium 100 MG capsule  Commonly known as:  COLACE  Take 1 capsule (100 mg total) by mouth 2 (two) times daily.     ferrous sulfate 325 (65 FE) MG tablet  Take 1 tablet (325 mg total) by mouth 3 (three) times daily after meals.     flecainide 50 MG tablet  Commonly known as:  TAMBOCOR  TAKE 1 TABLET (50 MG TOTAL) BY MOUTH 2 (TWO) TIMES DAILY.     HYDROcodone-acetaminophen 7.5-325 MG per tablet  Commonly known as:  NORCO  Take 1-2 tablets by mouth every 4 (four) hours as needed for moderate pain or severe pain.     loratadine 10 MG tablet  Commonly known as:  CLARITIN  Take 10 mg by mouth daily.     LORazepam 0.5 MG tablet  Commonly known as:  ATIVAN  Take 1 tablet (0.5 mg total) by mouth 2 (two) times daily as needed for anxiety.     MELATONIN PO  Take 1 tablet by mouth at bedtime as needed (for sleep).     polyethylene glycol packet  Commonly known as:  MIRALAX / GLYCOLAX  Take 17 g by mouth 2 (two) times daily.     tiZANidine 4 MG tablet  Commonly known as:  ZANAFLEX  Take 1 tablet (4 mg total) by mouth every 6 (six) hours as needed for muscle spasms.         Signed: [name]. Pleasant Britz   PA-C  11/04/2014, 8:25 AM

## 2014-11-07 ENCOUNTER — Encounter: Payer: Self-pay | Admitting: Internal Medicine

## 2014-11-07 ENCOUNTER — Non-Acute Institutional Stay (SKILLED_NURSING_FACILITY): Payer: Medicare Other | Admitting: Internal Medicine

## 2014-11-07 DIAGNOSIS — I471 Supraventricular tachycardia: Secondary | ICD-10-CM

## 2014-11-07 DIAGNOSIS — D72829 Elevated white blood cell count, unspecified: Secondary | ICD-10-CM

## 2014-11-07 DIAGNOSIS — D62 Acute posthemorrhagic anemia: Secondary | ICD-10-CM

## 2014-11-07 DIAGNOSIS — F322 Major depressive disorder, single episode, severe without psychotic features: Secondary | ICD-10-CM | POA: Diagnosis not present

## 2014-11-07 DIAGNOSIS — M1990 Unspecified osteoarthritis, unspecified site: Secondary | ICD-10-CM

## 2014-11-07 DIAGNOSIS — G4700 Insomnia, unspecified: Secondary | ICD-10-CM

## 2014-11-07 DIAGNOSIS — M1611 Unilateral primary osteoarthritis, right hip: Secondary | ICD-10-CM

## 2014-11-07 DIAGNOSIS — K5900 Constipation, unspecified: Secondary | ICD-10-CM

## 2014-11-07 DIAGNOSIS — F329 Major depressive disorder, single episode, unspecified: Secondary | ICD-10-CM

## 2014-11-07 NOTE — Progress Notes (Signed)
Patient ID: Gabriella Jackson, female   DOB: [birth year], 81 y.o.   MRN: [id number]    [facility]  PCP: Gabriella Argyle, MD  Code Status: Full Code   Allergies  Allergen Reactions  . Codeine Nausea Only  . Sulfa Antibiotics     Cant remember    Chief Complaint  Patient presents with  . New Admit To SNF    New Admission      HPI:  81 y.o. patient is here for short term rehabilitation post hospital admission from 11/01/14-11/04/14 with right hip OA. She underwent right total hip arthroplasty. She is seen in her room today. Her pain is under control with current pain regimen. She has some loose stool and would like her laxative discontinued. She has occasional nausea. Denies any other concerns.   Review of Systems:  Constitutional: Negative for fever, chills, diaphoresis.  HENT: Negative for headache, congestion, nasal discharge Eyes: Negative for eye pain, blurred vision, double vision and discharge.  Respiratory: Negative for cough, shortness of breath and wheezing.   Cardiovascular: Negative for chest pain, palpitations, leg swelling.  Gastrointestinal: Negative for heartburn, vomiting, abdominal pain. Has poor appetite for several years. Had bowel movement this am Genitourinary: Negative for dysuria and flank pain.  Musculoskeletal: Negative for back pain, falls Skin: Negative for itching, rash.  Neurological: Negative for dizziness, tingling, focal weakness Psychiatric/Behavioral: Negative for depression   Past Medical History  Diagnosis Date  . Fuchs' corneal dystrophy 2002  . ITP (idiopathic thrombocytopenic purpura)     s/p splenectomy- remission  . SVT (supraventricular tachycardia)   . Urticaria   . Depressed   . DJD (degenerative joint disease)   . Osteopenia   . Dysrhythmia     hx SVT  . PONV (postoperative nausea and vomiting)   . Colon cancer 2002    s/p colonic resection- chemotherapy and surgery  . Hernia, inguinal, right    Past  Surgical History  Procedure Laterality Date  . Colon surgery    . Abdominal hysterectomy    . Small intestine surgery    . Splenectomy  1999  . Eye surgery      corneal replacements and cataracts removed.  . Rotator cuff repair Right   . Foot surgery      Foreign body" imbedded sewing needle"  . Total hip arthroplasty Right 11/01/2014    Procedure: RIGHT TOTAL HIP ARTHROPLASTY ANTERIOR APPROACH;  Surgeon: Paralee Cancel, MD;  Location: [facility]S;  Service: Orthopedics;  Laterality: Right;   Social History:   reports that she has never smoked. She does not have any smokeless tobacco history on file. She reports that she drinks about 3.5 oz of alcohol per week. She reports that she does not use illicit drugs.  Family History  Problem Relation Age of Onset  . CAD      Medications:   Medication List       This list is accurate as of: 11/07/14  3:45 PM.  Always use your most recent med list.               aspirin 325 MG EC tablet  Take 1 tablet (325 mg total) by mouth 2 (two) times daily.     citalopram 20 MG tablet  Commonly known as:  CELEXA  Take 20 mg by mouth every morning.     diltiazem 120 MG 24 hr capsule  Commonly known as:  CARDIZEM CD  TAKE 1 CAPSULE (120 MG TOTAL) BY  MOUTH DAILY.     docusate sodium 100 MG capsule  Commonly known as:  COLACE  Take 1 capsule (100 mg total) by mouth 2 (two) times daily.     ferrous sulfate 325 (65 FE) MG tablet  Take 1 tablet (325 mg total) by mouth 3 (three) times daily after meals.     flecainide 50 MG tablet  Commonly known as:  TAMBOCOR  TAKE 1 TABLET (50 MG TOTAL) BY MOUTH 2 (TWO) TIMES DAILY.     HYDROcodone-acetaminophen 7.5-325 MG per tablet  Commonly known as:  NORCO  Take 1-2 tablets by mouth every 4 (four) hours as needed for moderate pain or severe pain.     loratadine 10 MG tablet  Commonly known as:  CLARITIN  Take 10 mg by mouth daily.     LORazepam 0.5 MG tablet  Commonly known as:  ATIVAN  Take 1 tablet  (0.5 mg total) by mouth 2 (two) times daily as needed for anxiety.     MELATONIN PO  Take 1 tablet by mouth at bedtime as needed (for sleep).     polyethylene glycol packet  Commonly known as:  MIRALAX / GLYCOLAX  Take 17 g by mouth 2 (two) times daily.     tiZANidine 4 MG tablet  Commonly known as:  ZANAFLEX  Take 1 tablet (4 mg total) by mouth every 6 (six) hours as needed for muscle spasms.         Physical Exam: Filed Vitals:   11/07/14 1541  BP: 138/62  Pulse: 70  Temp: 97.4 F (36.3 C)  TempSrc: Oral  Resp: 20  Height: 5\' 3"  (1.6 m)  Weight: 114 lb (51.71 kg)  SpO2: 99%    General- elderly female, thin built, in no acute distress Head- normocephalic, atraumatic Throat- moist mucus membrane Eyes- PERRLA, EOMI, no pallor, no icterus, no discharge, normal conjunctiva, normal sclera Neck- no cervical lymphadenopathy Cardiovascular- normal s1,s2, no murmurs, palpable dorsalis pedis and radial pulses, right trace leg edema Respiratory- bilateral clear to auscultation, no wheeze, no rhonchi, no crackles, no use of accessory muscles Abdomen- bowel sounds present, soft, non tender Musculoskeletal- able to move all 4 extremities, right leg ROM limited, using walker Neurological- no focal deficit, alert and oriented to person, place and time Skin- warm and dry, right hip surgical incision with aquacel dressing Psychiatry- normal mood and affect    Labs reviewed: Basic Metabolic Panel:  Recent Labs  10/26/14 1430 11/02/14 0454 11/03/14 0440  NA 139 139 138  K 3.9 5.2* 3.9  CL 102 105 104  CO2 26 28 27   GLUCOSE 96 149* 128*  BUN 17 11 12   CREATININE 0.67 0.68 0.66  CALCIUM 9.3 8.6* 8.4*   CBC:  Recent Labs  10/26/14 1430 11/02/14 0454 11/03/14 0440  WBC 6.3 13.3* 13.9*  HGB 13.1 11.4* 11.0*  HCT 40.2 35.1* 33.5*  MCV 97.8 98.3 98.8  PLT 228 215 216    Assessment/Plan  Right hip OA S/p right total hip arthroplasty. Will have her work with  physical therapy and occupational therapy team to help with gait training and muscle strengthening exercises.fall precautions. Skin care. Encourage to be out of bed. Continue aspirin ec 325 mg bid fr dvt prophylaxis. Continue norco 7.5-325 1-2 tab q4h prn pain and zanaflex 4 mg q6h prn muscle spasm. Has follow up with orthopedics  Constipation Stable, d/c miralax and change her colace to 100 mg daily prn and reassess  Blood loss anemia Post op, monitor  h&h, continue ferrous sulfate 325 mg tid  Leukocytosis No signs of infection on exam, likely from reactive leukocytosis. Monitor clinically  Chronic depression Stable mood, continue celexa 20 mg daily  SVT Rate controlled. Continue cardizem  120 mg daily, flecainide 50 mg bid and monitor  Insomnia Continue melatonin daily as needed   Goals of care: short term rehabilitation   Labs/tests ordered: cbc, bmp 1 week  Family/ staff Communication: reviewed care plan with patient and nursing supervisor    Blanchie Serve, MD  [facility] [phone] (Monday-Friday 8 am - 5 pm) [phone] (afterhours)

## 2014-11-17 ENCOUNTER — Encounter: Payer: Self-pay | Admitting: Adult Health

## 2014-11-17 ENCOUNTER — Non-Acute Institutional Stay (SKILLED_NURSING_FACILITY): Payer: Medicare Other | Admitting: Adult Health

## 2014-11-17 DIAGNOSIS — F329 Major depressive disorder, single episode, unspecified: Secondary | ICD-10-CM

## 2014-11-17 DIAGNOSIS — G4700 Insomnia, unspecified: Secondary | ICD-10-CM

## 2014-11-17 DIAGNOSIS — M1990 Unspecified osteoarthritis, unspecified site: Secondary | ICD-10-CM | POA: Diagnosis not present

## 2014-11-17 DIAGNOSIS — I471 Supraventricular tachycardia: Secondary | ICD-10-CM

## 2014-11-17 DIAGNOSIS — K5900 Constipation, unspecified: Secondary | ICD-10-CM | POA: Diagnosis not present

## 2014-11-17 DIAGNOSIS — F419 Anxiety disorder, unspecified: Secondary | ICD-10-CM

## 2014-11-17 DIAGNOSIS — F322 Major depressive disorder, single episode, severe without psychotic features: Secondary | ICD-10-CM | POA: Diagnosis not present

## 2014-11-17 DIAGNOSIS — D62 Acute posthemorrhagic anemia: Secondary | ICD-10-CM

## 2014-11-17 DIAGNOSIS — M1611 Unilateral primary osteoarthritis, right hip: Secondary | ICD-10-CM

## 2014-11-17 DIAGNOSIS — J309 Allergic rhinitis, unspecified: Secondary | ICD-10-CM

## 2014-11-17 NOTE — Progress Notes (Signed)
Patient ID: Gabriella Jackson, female   DOB: 08-17-1924, 81 y.o.   MRN: [id number]    DATE:  11/17/2014 MRN:  [id number]  BIRTHDAY: 14-Mar-1925  Facility:  Nursing Home Location:  [facility] Room Number: 024-O  LEVEL OF CARE:  SNF (31)  Contact Information    Name Relation Home Work [place] Daughter [phone]  [phone]      Chief Complaint  Patient presents with  . Discharge Note    Arthritis S/P right total hip arthroplasty, Depression, Insomnia, Constipation, Anemia, SVT, allergic rhinitis and Anxiety    HISTORY OF PRESENT ILLNESS:  This is an 81-tear-old female who is for discharge home with Home health PT for endurance, OT for ADLs and CNA for showers. DME:  3-in-1 bedside commode, rolling walker and shower bench. She has been admitted to [facility] on 11/04/14 from [facility] with Arthritis S/P right total hip arthroplasty.   Patient was admitted to this facility for short-term rehabilitation after the patient's recent hospitalization.  Patient has completed SNF rehabilitation and therapy has cleared the patient for discharge.  PAST MEDICAL HISTORY:  Past Medical History  Diagnosis Date  . Fuchs' corneal dystrophy 2002  . ITP (idiopathic thrombocytopenic purpura)     s/p splenectomy- remission  . SVT (supraventricular tachycardia)   . Urticaria   . Depressed   . DJD (degenerative joint disease)   . Osteopenia   . Dysrhythmia     hx SVT  . PONV (postoperative nausea and vomiting)   . Colon cancer 2002    s/p colonic resection- chemotherapy and surgery  . Hernia, inguinal, right      CURRENT MEDICATIONS: Reviewed  Patient's Medications  New Prescriptions   No medications on file  Previous Medications   ACETAMINOPHEN (TYLENOL) 325 MG SUPPOSITORY    Take 325 mg by mouth every 4 (four) hours as needed.   ASPIRIN EC 325 MG EC TABLET    Take 1 tablet (325 mg total) by mouth 2 (two) times daily.   CITALOPRAM (CELEXA) 20 MG TABLET    Take 20 mg by mouth every morning.    DILTIAZEM (CARDIZEM CD) 120 MG 24 HR CAPSULE    TAKE 1 CAPSULE (120 MG TOTAL) BY MOUTH DAILY.   DOCUSATE SODIUM (COLACE) 100 MG CAPSULE    Take 100 mg by mouth daily as needed for mild constipation.   FLECAINIDE (TAMBOCOR) 50 MG TABLET    TAKE 1 TABLET (50 MG TOTAL) BY MOUTH 2 (TWO) TIMES DAILY.   LORATADINE (CLARITIN) 10 MG TABLET    Take 10 mg by mouth daily.   LORAZEPAM (ATIVAN) 0.5 MG TABLET    Take 1 tablet (0.5 mg total) by mouth 2 (two) times daily as needed for anxiety.   MELATONIN PO    Take 1 tablet by mouth at bedtime as needed (for sleep).   TIZANIDINE (ZANAFLEX) 4 MG TABLET    Take 1 tablet (4 mg total) by mouth every 6 (six) hours as needed for muscle spasms.  Modified Medications   No medications on file  Discontinued Medications   DOCUSATE SODIUM (COLACE) 100 MG CAPSULE    Take 1 capsule (100 mg total) by mouth 2 (two) times daily.   FERROUS SULFATE 325 (65 FE) MG TABLET    Take 1 tablet (325 mg total) by mouth 3 (three) times daily after meals.   HYDROCODONE-ACETAMINOPHEN (NORCO) 7.5-325 MG PER TABLET    Take 1-2 tablets by  mouth every 4 (four) hours as needed for moderate pain or severe pain.   POLYETHYLENE GLYCOL (MIRALAX / GLYCOLAX) PACKET    Take 17 g by mouth 2 (two) times daily.     Allergies  Allergen Reactions  . Codeine Nausea Only  . Sulfa Antibiotics     Cant remember     REVIEW OF SYSTEMS:  GENERAL: no change in appetite, no fatigue, no weight changes, no fever, chills or weakness EYES: Denies change in vision, dry eyes, eye pain, itching or discharge EARS: Denies change in hearing, ringing in ears, or earache NOSE: Denies nasal congestion or epistaxis MOUTH and THROAT: Denies oral discomfort, gingival pain or bleeding, pain from teeth or hoarseness   RESPIRATORY: no cough, SOB, DOE, wheezing, hemoptysis CARDIAC: no chest pain, edema or palpitations GI: no abdominal pain,  diarrhea, constipation, heart burn, nausea or vomiting GU: Denies dysuria, frequency, hematuria, incontinence, or discharge PSYCHIATRIC: Denies feeling of depression or anxiety. No report of hallucinations, insomnia, paranoia, or agitation   PHYSICAL EXAMINATION  GENERAL APPEARANCE: Well nourished. In no acute distress. Normal body habitus SKIN:  Right hip surgical incision is dry, no redness HEAD: Normal in size and contour. No evidence of trauma EYES: Lids open and close normally. No blepharitis, entropion or ectropion. PERRL. Conjunctivae are clear and sclerae are white. Lenses are without opacity EARS: Pinnae are normal. Patient hears normal voice tunes of the examiner MOUTH and THROAT: Lips are without lesions. Oral mucosa is moist and without lesions. Tongue is normal in shape, size, and color and without lesions NECK: supple, trachea midline, no neck masses, no thyroid tenderness, no thyromegaly LYMPHATICS: no LAN in the neck, no supraclavicular LAN RESPIRATORY: breathing is even & unlabored, BS CTAB CARDIAC: RRR, no murmur,no extra heart sounds, no edema GI: abdomen soft, normal BS, no masses, no tenderness, no hepatomegaly, no splenomegaly EXTREMITIES:  Able to move X 4 extremities; walks with walker PSYCHIATRIC: Alert and oriented X 3. Affect and behavior are appropriate  LABS/RADIOLOGY: Labs reviewed: 11/14/14  Wbc 8.3  hgb 12.4  hct 37  Platelet 509  NA 139  K 4.9  Glucose 90  BUN 13  Creatinine 0.66  Calcium 8.6 Basic Metabolic Panel:  Recent Labs  10/26/14 1430 11/02/14 0454 11/03/14 0440  NA 139 139 138  K 3.9 5.2* 3.9  CL 102 105 104  CO2 26 28 27   GLUCOSE 96 149* 128*  BUN 17 11 12   CREATININE 0.67 0.68 0.66  CALCIUM 9.3 8.6* 8.4*   CBC:  Recent Labs  10/26/14 1430 11/02/14 0454 11/03/14 0440  WBC 6.3 13.3* 13.9*  HGB 13.1 11.4* 11.0*  HCT 40.2 35.1* 33.5*  MCV 97.8 98.3 98.8  PLT 228 215 216     Dg C-arm 1-60 Min-no Report  11/01/2014    CLINICAL DATA: surgery   C-ARM 1-60 MINUTES  Fluoroscopy was utilized by the requesting physician.  No radiographic  interpretation.    Dg Hip Port Unilat With Pelvis 1v Right  11/01/2014   CLINICAL DATA:  Status post right hip joint replacement  EXAM: DG HIP (WITH OR WITHOUT PELVIS) 1V PORT RIGHT  COMPARISON:  Right hip series of February 25, 2014  FINDINGS: The patient has undergone right total hip joint prosthesis placement. Radiographic positioning of the prosthetic components is good. The interface with the native bone appears normal. There is small amounts of soft tissue gas over the right proximal thigh.  IMPRESSION: The patient has undergone right total hip joint prosthesis  placement without evidence of immediate postprocedure complication.   Electronically Signed   By: David  Martinique M.D.   On: 11/01/2014 09:47    ASSESSMENT/PLAN:  Arthritis S/P right total hip arthroplasty anterior approach - for Home health PT, OT and CNA; follow-up with Dr. Alvan Dame, ortho[edic surgeon; continue ASA 325 mg 1 tab PO BID x 4 weeks; Tylenol 650 mg Q 4 hours PRN for pain; and Zanaflex 4 mg 1 tab PO Q 6 hours PRN  for muscle spasm   Depression - mood is stable; continue Celexa 20 mg 1 tab PO daily  Constipation - continue Colace 100 mg 1 capsule PO daily PRN  Anemia, acute blood loss - hgb 12.4; discontinue FeSO4  SVT - rate controlled; continue Cardizem 120 mg 24 hr 1 capsule daily and Tambocor 50 mg twice a day  Allergic rhinitis - continue Claritin 10 mg 1 tab PO daily  Anxiety - continue Ativan 0.5 mg twice a day PRN  Insomnia - continue Melatonin 3 mg 1 tab PO Q HS PRN    I have filled out patient's discharge paperwork and written prescriptions.  Patient will receive home health PT, OT and CNA.  DME provided:  3-in-1 bedside commode, rolling walker and shower bench  Total discharge time: Greater than 30 minutes  Discharge time involved coordination of the discharge process with social worker,  nursing staff and therapy department. Medical justification for home health services/DME verified.    [facility], NP [employer] [phone]

## 2014-11-27 ENCOUNTER — Encounter (HOSPITAL_COMMUNITY): Payer: Self-pay | Admitting: *Deleted

## 2014-11-27 ENCOUNTER — Emergency Department (HOSPITAL_COMMUNITY)
Admission: EM | Admit: 2014-11-27 | Discharge: 2014-11-27 | Disposition: A | Discharge status: Home / Self Care | Payer: Medicare Other | Attending: Emergency Medicine | Admitting: Emergency Medicine

## 2014-11-27 DIAGNOSIS — Z8719 Personal history of other diseases of the digestive system: Secondary | ICD-10-CM | POA: Diagnosis not present

## 2014-11-27 DIAGNOSIS — I499 Cardiac arrhythmia, unspecified: Secondary | ICD-10-CM | POA: Insufficient documentation

## 2014-11-27 DIAGNOSIS — L723 Sebaceous cyst: Secondary | ICD-10-CM | POA: Insufficient documentation

## 2014-11-27 DIAGNOSIS — Z862 Personal history of diseases of the blood and blood-forming organs and certain disorders involving the immune mechanism: Secondary | ICD-10-CM | POA: Insufficient documentation

## 2014-11-27 DIAGNOSIS — Z79899 Other long term (current) drug therapy: Secondary | ICD-10-CM | POA: Diagnosis not present

## 2014-11-27 DIAGNOSIS — M1990 Unspecified osteoarthritis, unspecified site: Secondary | ICD-10-CM | POA: Diagnosis not present

## 2014-11-27 DIAGNOSIS — Z7982 Long term (current) use of aspirin: Secondary | ICD-10-CM | POA: Insufficient documentation

## 2014-11-27 DIAGNOSIS — I471 Supraventricular tachycardia: Secondary | ICD-10-CM | POA: Insufficient documentation

## 2014-11-27 DIAGNOSIS — F329 Major depressive disorder, single episode, unspecified: Secondary | ICD-10-CM | POA: Insufficient documentation

## 2014-11-27 DIAGNOSIS — R51 Headache: Secondary | ICD-10-CM | POA: Diagnosis present

## 2014-11-27 DIAGNOSIS — Z85038 Personal history of other malignant neoplasm of large intestine: Secondary | ICD-10-CM | POA: Insufficient documentation

## 2014-11-27 MED ORDER — LIDOCAINE HCL (PF) 1 % IJ SOLN
5.0000 mL | Freq: Once | INTRAMUSCULAR | Status: AC
  Administered 2014-11-27: 5 mL
  Filled 2014-11-27: qty 5

## 2014-11-27 NOTE — ED Notes (Signed)
Pt reports abscess in back of her head which she's had a cyst there for years.   States she must've "bumped" it somehow, noticed a scab and it started to drain.

## 2014-11-27 NOTE — ED Provider Notes (Signed)
CSN: [id number]     Arrival date & time 11/27/14  1941 History  This chart was scribed for non-physician practitioner, Lenard Galloway, working with Varney Biles, MD by Evelene Croon, ED Scribe. This patient was seen in room WTR5/WTR5 and the patient's care was started at 8:40 PM.    Chief Complaint  Patient presents with  . Sore    The history is provided by the patient. No language interpreter was used.     HPI Comments:  Gabriella Jackson is a 81 y.o. female who presents to the Emergency Department complaining of pain to her left scalp. Pt states she's had a cyst to the area for years, a few days ago she noticed blood on her pillow and daughter checked her scalp and saw a hole on the cyst. Pt believes she may have bumped the cyst on something.  No alleviating factors or associated symptoms noted.   Past Medical History  Diagnosis Date  . Fuchs' corneal dystrophy 2002  . ITP (idiopathic thrombocytopenic purpura)     s/p splenectomy- remission  . SVT (supraventricular tachycardia)   . Urticaria   . Depressed   . DJD (degenerative joint disease)   . Osteopenia   . Dysrhythmia     hx SVT  . PONV (postoperative nausea and vomiting)   . Colon cancer 2002    s/p colonic resection- chemotherapy and surgery  . Hernia, inguinal, right    Past Surgical History  Procedure Laterality Date  . Colon surgery    . Abdominal hysterectomy    . Small intestine surgery    . Splenectomy  1999  . Eye surgery      corneal replacements and cataracts removed.  . Rotator cuff repair Right   . Foot surgery      Foreign body" imbedded sewing needle"  . Total hip arthroplasty Right 11/01/2014    Procedure: RIGHT TOTAL HIP ARTHROPLASTY ANTERIOR APPROACH;  Surgeon: Paralee Cancel, MD;  Location: [facility];  Service: Orthopedics;  Laterality: Right;   Family History  Problem Relation Age of Onset  . CAD     Social History  Substance Use Topics  . Smoking status: Never Smoker   . Smokeless  tobacco: None  . Alcohol Use: 3.5 oz/week    7 Standard drinks or equivalent per week     Comment: small glass of wine each day   OB History    No data available     Review of Systems  Constitutional: Negative for fever.  Skin: Positive for rash (abscess).      Allergies  Codeine and Sulfa antibiotics  Home Medications   Prior to Admission medications   Medication Sig Start Date End Date Taking? Authorizing Provider  acetaminophen (TYLENOL) 325 MG suppository Take 325 mg by mouth every 4 (four) hours as needed.    Historical Provider, MD  aspirin EC 325 MG EC tablet Take 1 tablet (325 mg total) by mouth 2 (two) times daily. 11/04/14 12/02/14  Danae Orleans, PA-C  citalopram (CELEXA) 20 MG tablet Take 20 mg by mouth every morning.     Historical Provider, MD  diltiazem (CARDIZEM CD) 120 MG 24 hr capsule TAKE 1 CAPSULE (120 MG TOTAL) BY MOUTH DAILY. 02/07/14   Thompson Grayer, MD  docusate sodium (COLACE) 100 MG capsule Take 100 mg by mouth daily as needed for mild constipation.    Historical Provider, MD  flecainide (TAMBOCOR) 50 MG tablet TAKE 1 TABLET (50 MG TOTAL) BY MOUTH 2 (  TWO) TIMES DAILY. 02/07/14   Thompson Grayer, MD  loratadine (CLARITIN) 10 MG tablet Take 10 mg by mouth daily.    Historical Provider, MD  LORazepam (ATIVAN) 0.5 MG tablet Take 1 tablet (0.5 mg total) by mouth 2 (two) times daily as needed for anxiety. 11/04/14   Danae Orleans, PA-C  MELATONIN PO Take 1 tablet by mouth at bedtime as needed (for sleep).    Historical Provider, MD  tiZANidine (ZANAFLEX) 4 MG tablet Take 1 tablet (4 mg total) by mouth every 6 (six) hours as needed for muscle spasms. 11/04/14   Danae Orleans, PA-C   BP 141/72 mmHg  Pulse 110  Temp(Src) 98.2 F (36.8 C) (Oral)  Resp 18  SpO2 97% Physical Exam  Constitutional: She is oriented to person, place, and time. She appears well-developed and well-nourished. No distress.  HENT:  Head: Normocephalic and atraumatic.  Eyes: Conjunctivae are  normal.  Neck: Normal range of motion.  Cardiovascular: Normal rate.   Pulmonary/Chest: Effort normal.  Abdominal: She exhibits no distension.  Musculoskeletal: Normal range of motion.  Neurological: She is alert and oriented to person, place, and time.  Skin: Skin is warm and dry.  Fluctuant cyst to left parietal scalp without redness Moderate tenderness  No active drainage  Nursing note and vitals reviewed.   ED Course  Procedures   DIAGNOSTIC STUDIES:  Oxygen Saturation is 97% on RA, normal by my interpretation.    COORDINATION OF CARE:  8:46 PM Discussed treatment plan with pt at bedside and pt agreed to plan.  8:49 PM INCISION AND DRAINAGE PROCEDURE NOTE: Patient identification was confirmed and verbal consent was obtained. This procedure was performed by Dewaine Oats PA-C at 8:49 PM. Site: left parietal scalp Sterile procedures observed no Needle size: 25 G Anesthetic used (type and amt): 2% lidocaine with epi Blade size: #11 Drainage: large sebaceous material, slightly bloody Complexity: Complex Packing used no Site anesthetized, incision made over site, wound drained and explored loculations, rinsed with copious amounts of normal saline, wound packed with sterile gauze, covered with dry, sterile dressing.  Pt tolerated procedure well without complications.  Instructions for care discussed verbally and pt provided with additional written instructions for homecare and f/u.   Labs Review Labs Reviewed - No data to display  Imaging Review No results found. I have personally reviewed and evaluated these images and lab results as part of my medical decision-making.   EKG Interpretation None      MDM   Final diagnoses:  None    1. Sebaceous cyst, scalp  Cyst drained with moderate amount of sebaceous material. Dressing applied with packing. Uncomplicated procedure.  I personally performed the services described in this documentation, which was  scribed in my presence. The recorded information has been reviewed and is accurate.     Charlann Lange, PA-C 11/28/14 [id number]  Varney Biles, MD 11/28/14 [phone]

## 2014-11-27 NOTE — Discharge Instructions (Signed)
°  Epidermal Cyst An epidermal cyst is usually a small, painless lump under the skin. Cysts often occur on the face, neck, stomach, chest, or genitals. The cyst may be filled with a bad smelling paste. Do not pop your cyst. Popping the cyst can cause pain and puffiness (swelling). HOME CARE   Only take medicines as told by your doctor.  Take your medicine (antibiotics) as told. Finish it even if you start to feel better. GET HELP RIGHT AWAY IF:  Your cyst is tender, red, or puffy.  You are not getting better, or you are getting worse.  You have any questions or concerns. MAKE SURE YOU:  Understand these instructions.  Will watch your condition.  Will get help right away if you are not doing well or get worse. Document Released: 05/02/2004 Document Revised: 09/24/2011 Document Reviewed: 10/01/2010 [facility] Patient Information 2015 [place], [place]. This information is not intended to replace advice given to you by your health care provider. Make sure you discuss any questions you have with your health care provider. Care After Refer to this sheet in the next few weeks. These instructions provide you with information on caring for yourself after your procedure. Your caregiver may also give you more specific instructions. Your treatment has been planned according to current medical practices, but problems sometimes occur. Call your caregiver if you have any problems or questions after your procedure. HOME CARE INSTRUCTIONS   If antibiotic medicine is given, take it as directed. Finish it even if you start to feel better.  Only take over-the-counter or prescription medicines for pain, discomfort, or fever as directed by your caregiver.  Keep all follow-up appointments as directed by your caregiver.  Change any bandages (dressings) as directed by your caregiver. Replace old dressings with clean dressings.  Wash your hands before and after caring for your wound. You will receive specific  instructions for cleansing and caring for your wound.  SEEK MEDICAL CARE IF:   You have increased pain, swelling, or redness around the wound.  You have increased drainage, smell, or bleeding from the wound.  You have muscle aches, chills, or you feel generally sick.  You have a fever. MAKE SURE YOU:   Understand these instructions.  Will watch your condition.  Will get help right away if you are not doing well or get worse. Document Released: 06/17/2011 Document Reviewed: 06/17/2011 [facility] Patient Information 2015 [place]. This information is not intended to replace advice given to you by your health care provider. Make sure you discuss any questions you have with your health care provider.

## 2014-12-28 ENCOUNTER — Other Ambulatory Visit: Payer: Self-pay | Admitting: Adult Health

## 2015-02-24 ENCOUNTER — Other Ambulatory Visit: Payer: Self-pay | Admitting: Internal Medicine

## 2015-02-24 MED ORDER — DILTIAZEM HCL ER COATED BEADS 120 MG PO CP24: 120.0000 mg | ORAL_CAPSULE | Freq: Every day | ORAL | Status: DC

## 2015-04-06 ENCOUNTER — Other Ambulatory Visit: Payer: Self-pay

## 2015-04-06 ENCOUNTER — Ambulatory Visit (INDEPENDENT_AMBULATORY_CARE_PROVIDER_SITE_OTHER): Payer: Medicare Other | Admitting: Internal Medicine

## 2015-04-06 ENCOUNTER — Encounter: Payer: Self-pay | Admitting: Internal Medicine

## 2015-04-06 VITALS — BP 108/62 | HR 92 | Ht 63.0 in | Wt 108.2 lb

## 2015-04-06 DIAGNOSIS — I471 Supraventricular tachycardia: Secondary | ICD-10-CM | POA: Diagnosis not present

## 2015-04-06 MED ORDER — FLECAINIDE ACETATE 50 MG PO TABS: 25.0000 mg | ORAL_TABLET | Freq: Two times a day (BID) | ORAL | Status: DC

## 2015-04-06 NOTE — Patient Instructions (Signed)
Medication Instructions:  Your physician has recommended you make the following change in your medication:  1) Decrease Flecainide to 25 mg twice daily   Labwork: None ordered   Testing/Procedures: None ordered   Follow-Up: Your physician wants you to follow-up in: 6 months with Tommye Standard, PA You will receive a reminder letter in the mail two months in advance. If you don't receive a letter, please call our office to schedule the follow-up appointment.   Any Other Special Instructions Will Be Listed Below (If Applicable).     If you need a refill on your cardiac medications before your next appointment, please call your pharmacy.

## 2015-04-08 NOTE — Progress Notes (Signed)
PCP:  Mathews Argyle, MD  The patient presents today for routine electrophysiology followup.  Since last being seen in our clinic, the patient reports doing very well.  She has not had any further symptoms of SVT in several years.  She is referred back by Dr Felipa Eth to reassess as to whether or not she still need flecainide.  Today, she denies symptoms of palpitations, exertional chest pain, orthopnea, PND, lower extremity edema, dizziness, presyncope, syncope, or neurologic sequela.  She has rare fleeting chest pains at rest.  The patient feels that she is tolerating medications without difficulties and is otherwise without complaint today.   Past Medical History  Diagnosis Date  . Fuchs' corneal dystrophy 2002  . ITP (idiopathic thrombocytopenic purpura)     s/p splenectomy- remission  . SVT (supraventricular tachycardia) ([place])   . Urticaria   . Depressed   . DJD (degenerative joint disease)   . Osteopenia   . Dysrhythmia     hx SVT  . PONV (postoperative nausea and vomiting)   . Colon cancer ([place]) 2002    s/p colonic resection- chemotherapy and surgery  . Hernia, inguinal, right    Past Surgical History  Procedure Laterality Date  . Colon surgery    . Abdominal hysterectomy    . Small intestine surgery    . Splenectomy  1999  . Eye surgery      corneal replacements and cataracts removed.  . Rotator cuff repair Right   . Foot surgery      Foreign body" imbedded sewing needle"  . Total hip arthroplasty Right 11/01/2014    Procedure: RIGHT TOTAL HIP ARTHROPLASTY ANTERIOR APPROACH;  Surgeon: Paralee Cancel, MD;  Location: [facility];  Service: Orthopedics;  Laterality: Right;    Current Outpatient Prescriptions  Medication Sig Dispense Refill  . acetaminophen (TYLENOL) 325 MG suppository Take 325 mg by mouth every 4 (four) hours as needed (pain).     Marland Kitchen diltiazem (CARDIZEM CD) 120 MG 24 hr capsule Take 1 capsule (120 mg total) by mouth daily. 30 capsule 0  . flecainide  (TAMBOCOR) 50 MG tablet Take 0.5 tablets (25 mg total) by mouth 2 (two) times daily. 90 tablet 3  . loratadine (CLARITIN) 10 MG tablet Take 10 mg by mouth daily.    Marland Kitchen MELATONIN PO Take 1 tablet by mouth at bedtime as needed (for sleep).     No current facility-administered medications for this visit.    Allergies  Allergen Reactions  . Codeine Nausea Only  . Sulfa Antibiotics     Cannot remember    Social History   Social History  . Marital Status: Widowed    Spouse Name: N/A  . Number of Children: N/A  . Years of Education: N/A   Occupational History  . Not on file.   Social History Main Topics  . Smoking status: Never Smoker   . Smokeless tobacco: Not on file  . Alcohol Use: 3.5 oz/week    7 Standard drinks or equivalent per week     Comment: small glass of wine each day  . Drug Use: No  . Sexual Activity: No   Other Topics Concern  . Not on file   Social History Narrative   Lives in [place] alone.   Retired Network engineer          Family History  Problem Relation Age of Onset  . CAD      Physical Exam: Filed Vitals:   04/06/15 1611  BP: 108/62  Pulse: 92  Height: 5\' 3"  (1.6 m)  Weight: 108 lb 3.2 oz (49.079 kg)    GEN- The patient is well appearing, alert and oriented x 3 today.   Head- normocephalic, atraumatic Eyes-  Sclera clear, conjunctiva pink Ears- hearing intact Oropharynx- clear Neck- supple, no JVP Lymph- no cervical lymphadenopathy Lungs- Clear to ausculation bilaterally, normal work of breathing Heart- Regular rate and rhythm, no murmurs, rubs or gallops, PMI not laterally displaced GI- soft, NT, ND, + BS Extremities- no clubbing, cyanosis, or edema  ekg today reveals sinus rhythm   Assessment and Plan:  1. SVT (likely atrial tachycardia) Well controlled with flecainide Today we discussed options of stopping flecainide, reducing dose, or continuing at current rate.  She would like to try to reduce flecainide to 25mg  BID.  I  think that this is reasonable.  Ultimately, if her SVT remains controlled, we could try stopping flecainide and keeping it as a prn "pill in pocket" option.  Return in 6 months to see EP PA  Thompson Grayer MD

## 2015-04-24 ENCOUNTER — Other Ambulatory Visit: Payer: Self-pay | Admitting: *Deleted

## 2015-04-24 ENCOUNTER — Other Ambulatory Visit: Payer: Self-pay

## 2015-04-24 MED ORDER — DILTIAZEM HCL ER COATED BEADS 120 MG PO CP24: 120.0000 mg | ORAL_CAPSULE | Freq: Every day | ORAL | Status: DC

## 2015-04-24 NOTE — Telephone Encounter (Signed)
Medication Detail      Disp Refills Start End     flecainide (TAMBOCOR) 50 MG tablet 90 tablet 3 04/06/2015     Sig - Route: Take 0.5 tablets (25 mg total) by mouth 2 (two) times daily. - Oral    Class: No Print     Pharmacy    [facility] #[id number] - [place], [place]

## 2015-04-28 ENCOUNTER — Other Ambulatory Visit: Payer: Self-pay | Admitting: *Deleted

## 2015-04-28 MED ORDER — FLECAINIDE ACETATE 50 MG PO TABS: 25.0000 mg | ORAL_TABLET | Freq: Two times a day (BID) | ORAL | Status: DC

## 2015-04-28 NOTE — Telephone Encounter (Signed)
Patient called and was a upset as she has been without her flecainide for several days. She stated that [facility] informed her that they had contacted [place] earlier this week, but had not heard back from [place].

## 2015-06-07 DIAGNOSIS — E46 Unspecified protein-calorie malnutrition: Secondary | ICD-10-CM | POA: Diagnosis not present

## 2015-06-07 DIAGNOSIS — D692 Other nonthrombocytopenic purpura: Secondary | ICD-10-CM | POA: Diagnosis not present

## 2015-06-07 DIAGNOSIS — I471 Supraventricular tachycardia: Secondary | ICD-10-CM | POA: Diagnosis not present

## 2015-06-07 DIAGNOSIS — F329 Major depressive disorder, single episode, unspecified: Secondary | ICD-10-CM | POA: Diagnosis not present

## 2015-08-09 DIAGNOSIS — I471 Supraventricular tachycardia: Secondary | ICD-10-CM | POA: Diagnosis not present

## 2015-08-09 DIAGNOSIS — L989 Disorder of the skin and subcutaneous tissue, unspecified: Secondary | ICD-10-CM | POA: Diagnosis not present

## 2015-08-09 DIAGNOSIS — F325 Major depressive disorder, single episode, in full remission: Secondary | ICD-10-CM | POA: Diagnosis not present

## 2015-08-11 DIAGNOSIS — C44619 Basal cell carcinoma of skin of left upper limb, including shoulder: Secondary | ICD-10-CM | POA: Diagnosis not present

## 2015-08-11 DIAGNOSIS — C44612 Basal cell carcinoma of skin of right upper limb, including shoulder: Secondary | ICD-10-CM | POA: Diagnosis not present

## 2015-08-11 DIAGNOSIS — Z85828 Personal history of other malignant neoplasm of skin: Secondary | ICD-10-CM | POA: Diagnosis not present

## 2015-09-06 DIAGNOSIS — C44612 Basal cell carcinoma of skin of right upper limb, including shoulder: Secondary | ICD-10-CM | POA: Diagnosis not present

## 2015-09-06 DIAGNOSIS — Z85828 Personal history of other malignant neoplasm of skin: Secondary | ICD-10-CM | POA: Diagnosis not present

## 2015-09-07 ENCOUNTER — Ambulatory Visit: Payer: Medicare Other | Admitting: Physician Assistant

## 2015-09-11 DIAGNOSIS — F325 Major depressive disorder, single episode, in full remission: Secondary | ICD-10-CM | POA: Diagnosis not present

## 2015-10-10 NOTE — Progress Notes (Signed)
Cardiology Office Note Date:  10/11/2015  Patient ID:  Gabriella, Jackson Nov 03, 1924, MRN [id number] PCP:  Mathews Argyle, MD  Electrophysiologist:  Dr. [name]   Chief Complaint: planned EP f/u  History of Present Illness: Gabriella Jackson is a 82 y.o. female with history of PSVT, ITP in remission s/p splenectomy, colon cancer s/p resection/chemo, comes to the office to be seen for Dr. [name].  Last seen by him in dec 2016, at that time given length of time without SVT events discussion about Flecainide was held with, continuing unchanged, decreasing dose and stopping to use as a pill in the pocket approach, was decided to reduce to 25mg  BID with the option of stopping should she decide to in the future if no SVTevents.  The pt comes today feeling well, denies any kind of CP, palpitations or SOB, no dizziness, near syncope or syncope. She goes to the gym 2-3x week and ride stationary bike.   She reports tolerating her medicines well, is cutting the flecainide in 1/2 taking 1/2 tab BID, we talked about discontinuing it for an as needed approach for any recurrent SVT.  She states she doesn't really recall having much if any symptoms previously, only that her PMD found her HR fast and had her rush over.  She is due to see PMD next month she believes annual labs are planned for that visit.  Past Medical History  Diagnosis Date  . Fuchs' corneal dystrophy 2002  . ITP (idiopathic thrombocytopenic purpura)     s/p splenectomy- remission  . SVT (supraventricular tachycardia) ([place])   . Urticaria   . Depressed   . DJD (degenerative joint disease)   . Osteopenia   . Dysrhythmia     hx SVT  . PONV (postoperative nausea and vomiting)   . Colon cancer ([place]) 2002    s/p colonic resection- chemotherapy and surgery  . Hernia, inguinal, right     Past Surgical History  Procedure Laterality Date  . Colon surgery    . Abdominal hysterectomy    . Small intestine surgery    .  Splenectomy  1999  . Eye surgery      corneal replacements and cataracts removed.  . Rotator cuff repair Right   . Foot surgery      Foreign body" imbedded sewing needle"  . Total hip arthroplasty Right 11/01/2014    Procedure: RIGHT TOTAL HIP ARTHROPLASTY ANTERIOR APPROACH;  Surgeon: Paralee Cancel, MD;  Location: [facility];  Service: Orthopedics;  Laterality: Right;    Current Outpatient Prescriptions  Medication Sig Dispense Refill  . acetaminophen (TYLENOL) 325 MG suppository Take 325 mg by mouth every 4 (four) hours as needed (pain).     Marland Kitchen diltiazem (CARDIZEM CD) 120 MG 24 hr capsule Take 1 capsule (120 mg total) by mouth daily. 30 capsule 6  . flecainide (TAMBOCOR) 50 MG tablet Take 0.5 tablets (25 mg total) by mouth 2 (two) times daily. 90 tablet 3  . loratadine (CLARITIN) 10 MG tablet Take 10 mg by mouth daily.    Marland Kitchen MELATONIN PO Take 1 tablet by mouth at bedtime as needed (for sleep).    . mirtazapine (REMERON) 15 MG tablet Take 15 mg by mouth at bedtime.  0   No current facility-administered medications for this visit.    Allergies:   Codeine and Sulfa antibiotics   Social History:  The patient  reports that she has never smoked. She does not have any smokeless tobacco history on file.  She reports that she drinks about 3.5 oz of alcohol per week. She reports that she does not use illicit drugs.   Family History:  The patient's family history includes Cancer in her mother; Heart disease in her father; Stroke in her father.  ROS:  Please see the history of present illness. All other systems are reviewed and otherwise negative.   PHYSICAL EXAM:  VS:  BP 120/74 mmHg  Pulse 96  Ht 5\' 3"  (1.6 m)  Wt 113 lb (51.256 kg)  BMI 20.02 kg/m2 BMI: Body mass index is 20.02 kg/(m^2). Thin, well developed, in no acute distress HEENT: normocephalic, atraumatic Neck: no JVD, carotid bruits or masses Cardiac:  normal S1, S2; RRR; no significant murmurs, no rubs, or gallops Lungs:  clear to  auscultation bilaterally, no wheezing, rhonchi or rales Abd: soft, nontender MS: no deformity or atrophy Ext: no edema Skin: warm and dry, no rash Neuro:  No gross deficits appreciated Psych: euthymic mood, full affect   EKG:  Done today and reviewed by myself shows SR, QRS 72ms, PVC  02/11/13 Echocardiogram Study Conclusions - Left ventricle: The cavity size was normal. Wall thickness was normal. The estimated ejection fraction was 60%. Wall motion was normal; there were no regional wall motion abnormalities. Doppler parameters are consistent with abnormal left ventricular relaxation (grade 1 diastolic dysfunction). - Left atrium: The atrium was mildly dilated. - Right ventricle: The cavity size was normal. Systolic function was normal.  02/26/13: Lexiscan stress test  Impression  Exercise Capacity: Lexiscan with low level exercise. BP Response: Normal blood pressure response. Clinical Symptoms: No significant symptoms noted. ECG Impression: No significant ST segment change suggestive of ischemia. Comparison with Prior Nuclear Study: No previous nuclear study performed Overall Impression: Low risk stress nuclear study Mild decrease in LV function on surface images Suggest echo or MRI correlation No ischemia or infarction. LV Ejection Fraction: 49%. LV Wall Motion: SEE ABOVE   Recent Labs: 11/03/2014: BUN 12; Creatinine, Ser 0.66; Hemoglobin 11.0*; Platelets 216; Potassium 3.9; Sodium 138  No results found for requested labs within last 365 days.   CrCl cannot be calculated (Patient has no serum creatinine result on file.).   Wt Readings from Last 3 Encounters:  10/11/15 113 lb (51.256 kg)  04/06/15 108 lb 3.2 oz (49.079 kg)  11/17/14 116 lb 12.8 oz (52.98 kg)     Other studies reviewed: Additional studies/records reviewed today include: summarized above  ASSESSMENT AND PLAN:  1. PSVT     On Flecainide with cardizem     EKG stable     no  palpitations     No changes, after discussion about possibly stopping for PRN use, she states she will discuss it further at her next visit with Dr. [name], for now no changes.  Disposition: F/u with PMD as planned next month, Dr. [name] in 6 months, sooner if needed.  Current medicines are reviewed at length with the patient today.  The patient did not have any concerns regarding medicines.  Haywood Lasso, PA-C 10/11/2015 3:44 PM     [address]  [place] [address] [phone] (office)  [phone] (fax)

## 2015-10-11 ENCOUNTER — Encounter: Payer: Self-pay | Admitting: Physician Assistant

## 2015-10-11 ENCOUNTER — Ambulatory Visit (INDEPENDENT_AMBULATORY_CARE_PROVIDER_SITE_OTHER): Payer: Medicare Other | Admitting: Physician Assistant

## 2015-10-11 VITALS — BP 120/74 | HR 96 | Ht 63.0 in | Wt 113.0 lb

## 2015-10-11 DIAGNOSIS — I471 Supraventricular tachycardia: Secondary | ICD-10-CM | POA: Diagnosis not present

## 2015-10-11 NOTE — Patient Instructions (Signed)
Medication Instructions:  Your physician recommends that you continue on your current medications as directed. Please refer to the Current Medication list given to you today.    If you need a refill on your cardiac medications before your next appointment, please call your pharmacy.  Labwork:  NONE ORDER TODAY    Testing/Procedures:  NONE ORDER TODAY    Follow-Up:  Your physician wants you to follow-up in:  IN  6  MONTHS WITH DR ALLRED You will receive a reminder letter in the mail two months in advance. If you don't receive a letter, please call our office to schedule the follow-up appointment.      Any Other Special Instructions Will Be Listed Below (If Applicable).                                                                                                                                                   

## 2015-11-08 DIAGNOSIS — Z79899 Other long term (current) drug therapy: Secondary | ICD-10-CM | POA: Diagnosis not present

## 2015-11-08 DIAGNOSIS — F325 Major depressive disorder, single episode, in full remission: Secondary | ICD-10-CM | POA: Diagnosis not present

## 2015-11-08 DIAGNOSIS — E7800 Pure hypercholesterolemia, unspecified: Secondary | ICD-10-CM | POA: Diagnosis not present

## 2015-11-08 DIAGNOSIS — M85859 Other specified disorders of bone density and structure, unspecified thigh: Secondary | ICD-10-CM | POA: Diagnosis not present

## 2015-11-08 DIAGNOSIS — Z0000 Encounter for general adult medical examination without abnormal findings: Secondary | ICD-10-CM | POA: Diagnosis not present

## 2015-11-08 DIAGNOSIS — I471 Supraventricular tachycardia: Secondary | ICD-10-CM | POA: Diagnosis not present

## 2015-12-06 DIAGNOSIS — M85852 Other specified disorders of bone density and structure, left thigh: Secondary | ICD-10-CM | POA: Diagnosis not present

## 2015-12-06 DIAGNOSIS — M8588 Other specified disorders of bone density and structure, other site: Secondary | ICD-10-CM | POA: Diagnosis not present

## 2015-12-20 ENCOUNTER — Other Ambulatory Visit: Payer: Self-pay | Admitting: Internal Medicine

## 2016-01-12 DIAGNOSIS — M85861 Other specified disorders of bone density and structure, right lower leg: Secondary | ICD-10-CM | POA: Diagnosis not present

## 2016-01-12 DIAGNOSIS — M8580 Other specified disorders of bone density and structure, unspecified site: Secondary | ICD-10-CM | POA: Diagnosis not present

## 2016-03-13 DIAGNOSIS — D485 Neoplasm of uncertain behavior of skin: Secondary | ICD-10-CM | POA: Diagnosis not present

## 2016-03-13 DIAGNOSIS — L988 Other specified disorders of the skin and subcutaneous tissue: Secondary | ICD-10-CM | POA: Diagnosis not present

## 2016-03-13 DIAGNOSIS — Z85828 Personal history of other malignant neoplasm of skin: Secondary | ICD-10-CM | POA: Diagnosis not present

## 2016-03-13 DIAGNOSIS — L814 Other melanin hyperpigmentation: Secondary | ICD-10-CM | POA: Diagnosis not present

## 2016-03-13 DIAGNOSIS — L821 Other seborrheic keratosis: Secondary | ICD-10-CM | POA: Diagnosis not present

## 2016-04-17 ENCOUNTER — Other Ambulatory Visit: Payer: Self-pay | Admitting: Internal Medicine

## 2016-04-19 ENCOUNTER — Other Ambulatory Visit: Payer: Self-pay | Admitting: *Deleted

## 2016-04-19 MED ORDER — DILTIAZEM HCL ER COATED BEADS 120 MG PO CP24: 120.0000 mg | ORAL_CAPSULE | Freq: Every day | ORAL | 1 refills | Status: DC

## 2016-04-24 ENCOUNTER — Ambulatory Visit: Payer: Medicare Other | Admitting: Internal Medicine

## 2016-05-29 ENCOUNTER — Encounter: Payer: Self-pay | Admitting: Internal Medicine

## 2016-05-29 ENCOUNTER — Ambulatory Visit (INDEPENDENT_AMBULATORY_CARE_PROVIDER_SITE_OTHER): Payer: Medicare Other | Admitting: Internal Medicine

## 2016-05-29 VITALS — BP 138/78 | HR 101 | Ht 64.0 in | Wt 107.0 lb

## 2016-05-29 DIAGNOSIS — I471 Supraventricular tachycardia: Secondary | ICD-10-CM | POA: Diagnosis not present

## 2016-05-29 NOTE — Progress Notes (Signed)
PCP:  Mathews Argyle, MD  The patient presents today for routine electrophysiology followup.  Since last being seen in our clinic, the patient reports doing very well.  She has not had any further symptoms of SVT in several years.    Today, she denies symptoms of palpitations, exertional chest pain, orthopnea, PND, lower extremity edema, dizziness, presyncope, syncope, or neurologic sequela.     The patient feels that she is tolerating medications without difficulties and is otherwise without complaint today.   Past Medical History:  Diagnosis Date  . Colon cancer ([place]) 2002   s/p colonic resection- chemotherapy and surgery  . Depressed   . DJD (degenerative joint disease)   . Dysrhythmia    hx SVT  . Fuchs' corneal dystrophy 2002  . Hernia, inguinal, right   . ITP (idiopathic thrombocytopenic purpura)    s/p splenectomy- remission  . Osteopenia   . PONV (postoperative nausea and vomiting)   . SVT (supraventricular tachycardia) ([place])   . Urticaria    Past Surgical History:  Procedure Laterality Date  . ABDOMINAL HYSTERECTOMY    . COLON SURGERY    . EYE SURGERY     corneal replacements and cataracts removed.  Marland Kitchen FOOT SURGERY     Foreign body" imbedded sewing needle"  . ROTATOR CUFF REPAIR Right   . SMALL INTESTINE SURGERY    . SPLENECTOMY  1999  . TOTAL HIP ARTHROPLASTY Right 11/01/2014   Procedure: RIGHT TOTAL HIP ARTHROPLASTY ANTERIOR APPROACH;  Surgeon: Paralee Cancel, MD;  Location: [facility];  Service: Orthopedics;  Laterality: Right;    Current Outpatient Prescriptions  Medication Sig Dispense Refill  . acetaminophen (TYLENOL) 325 MG suppository Take 325 mg by mouth every 4 (four) hours as needed (pain).     . CALCIUM PO Take 1 tablet by mouth daily.    . Cholecalciferol (VITAMIN D3 PO) Take 1 capsule by mouth daily.    Marland Kitchen diltiazem (CARDIZEM CD) 120 MG 24 hr capsule Take 1 capsule (120 mg total) by mouth daily. 90 capsule 1  . flecainide (TAMBOCOR) 50 MG tablet Take  0.5 tablets (25 mg total) by mouth 2 (two) times daily. 90 tablet 3  . loratadine (CLARITIN) 10 MG tablet Take 10 mg by mouth daily.    Marland Kitchen MELATONIN PO Take 1 tablet by mouth at bedtime as needed (for sleep).    . mirtazapine (REMERON) 15 MG tablet Take 15 mg by mouth at bedtime.  0   No current facility-administered medications for this visit.     Allergies  Allergen Reactions  . Codeine Nausea Only  . Sulfa Antibiotics     Cannot remember    Social History   Social History  . Marital status: Widowed    Spouse name: N/A  . Number of children: N/A  . Years of education: N/A   Occupational History  . Not on file.   Social History Main Topics  . Smoking status: Never Smoker  . Smokeless tobacco: Never Used  . Alcohol use 3.5 oz/week    7 Standard drinks or equivalent per week     Comment: small glass of wine each day  . Drug use: No  . Sexual activity: No   Other Topics Concern  . Not on file   Social History Narrative   Lives in [place] alone.   Retired Network engineer          Family History  Problem Relation Age of Onset  . Stroke Father   . Heart  disease Father   . Cancer Mother     Physical Exam: Vitals:   05/29/16 1156  BP: 138/78  Pulse: (!) 101  Weight: 107 lb (48.5 kg)  Height: 5\' 4"  (1.626 m)    GEN- The patient is well appearing, alert and oriented x 3 today.   Head- normocephalic, atraumatic Eyes-  Sclera clear, conjunctiva pink Ears- hearing intact Oropharynx- clear Neck- supple,   Lungs- Clear to ausculation bilaterally, normal work of breathing Heart- Regular rate and rhythm, no murmurs, rubs or gallops, PMI not laterally displaced GI- soft, NT, ND, + BS Extremities- no clubbing, cyanosis, or edema  ekg today reveals sinus rhythm 101 bpm  Assessment and Plan:  1. SVT (likely atrial tachycardia) Well controlled with flecainide for several years We will stop flecainide at this time If she has further SVT, we could consider  flecainide as a prn "pill in pocket" option.  Return in 12 months to see EP PA  Thompson Grayer MD, [facility] 05/29/2016 12:29 PM

## 2016-05-29 NOTE — Patient Instructions (Signed)
Medication Instructions:  Your physician has recommended you make the following change in your medication:  1) Stop Flecainide    Labwork: None ordered   Testing/Procedures: None ordered   Follow-Up: Your physician wants you to follow-up in: 12 months with Tommye Standard, PA You will receive a reminder letter in the mail two months in advance. If you don't receive a letter, please call our office to schedule the follow-up appointment.   Any Other Special Instructions Will Be Listed Below (If Applicable).     If you need a refill on your cardiac medications before your next appointment, please call your pharmacy.

## 2016-06-05 DIAGNOSIS — Z961 Presence of intraocular lens: Secondary | ICD-10-CM | POA: Diagnosis not present

## 2016-06-05 DIAGNOSIS — H524 Presbyopia: Secondary | ICD-10-CM | POA: Diagnosis not present

## 2016-06-05 DIAGNOSIS — H52203 Unspecified astigmatism, bilateral: Secondary | ICD-10-CM | POA: Diagnosis not present

## 2016-07-17 DIAGNOSIS — J322 Chronic ethmoidal sinusitis: Secondary | ICD-10-CM | POA: Diagnosis not present

## 2016-08-26 DIAGNOSIS — I8393 Asymptomatic varicose veins of bilateral lower extremities: Secondary | ICD-10-CM | POA: Diagnosis not present

## 2016-08-26 DIAGNOSIS — Z681 Body mass index (BMI) 19 or less, adult: Secondary | ICD-10-CM | POA: Diagnosis not present

## 2016-08-26 DIAGNOSIS — R634 Abnormal weight loss: Secondary | ICD-10-CM | POA: Diagnosis not present

## 2016-08-29 ENCOUNTER — Emergency Department (HOSPITAL_COMMUNITY)
Admission: EM | Admit: 2016-08-29 | Discharge: 2016-08-29 | Disposition: A | Discharge status: Home / Self Care | Payer: Medicare Other | Attending: Emergency Medicine | Admitting: Emergency Medicine

## 2016-08-29 ENCOUNTER — Emergency Department (HOSPITAL_BASED_OUTPATIENT_CLINIC_OR_DEPARTMENT_OTHER): Payer: Medicare Other

## 2016-08-29 ENCOUNTER — Telehealth: Payer: Self-pay | Admitting: Internal Medicine

## 2016-08-29 ENCOUNTER — Encounter (HOSPITAL_COMMUNITY): Payer: Self-pay | Admitting: Emergency Medicine

## 2016-08-29 ENCOUNTER — Emergency Department (HOSPITAL_COMMUNITY): Payer: Medicare Other

## 2016-08-29 ENCOUNTER — Encounter: Payer: Self-pay | Admitting: Internal Medicine

## 2016-08-29 DIAGNOSIS — M25562 Pain in left knee: Secondary | ICD-10-CM | POA: Diagnosis not present

## 2016-08-29 DIAGNOSIS — I480 Paroxysmal atrial fibrillation: Secondary | ICD-10-CM | POA: Diagnosis not present

## 2016-08-29 DIAGNOSIS — I4891 Unspecified atrial fibrillation: Secondary | ICD-10-CM | POA: Diagnosis not present

## 2016-08-29 DIAGNOSIS — Z96641 Presence of right artificial hip joint: Secondary | ICD-10-CM | POA: Insufficient documentation

## 2016-08-29 DIAGNOSIS — M79609 Pain in unspecified limb: Secondary | ICD-10-CM

## 2016-08-29 DIAGNOSIS — Z85038 Personal history of other malignant neoplasm of large intestine: Secondary | ICD-10-CM | POA: Diagnosis not present

## 2016-08-29 DIAGNOSIS — Z79899 Other long term (current) drug therapy: Secondary | ICD-10-CM | POA: Diagnosis not present

## 2016-08-29 DIAGNOSIS — R000 Tachycardia, unspecified: Secondary | ICD-10-CM | POA: Diagnosis not present

## 2016-08-29 DIAGNOSIS — S8992XA Unspecified injury of left lower leg, initial encounter: Secondary | ICD-10-CM | POA: Diagnosis not present

## 2016-08-29 LAB — CBC WITH DIFFERENTIAL/PLATELET
BASOS ABS: 0 10*3/uL (ref 0.0–0.1)
BASOS PCT: 1 %
EOS ABS: 0 10*3/uL (ref 0.0–0.7)
Eosinophils Relative: 1 %
HEMATOCRIT: 39.8 % (ref 36.0–46.0)
HEMOGLOBIN: 12.9 g/dL (ref 12.0–15.0)
Lymphocytes Relative: 21 %
Lymphs Abs: 1.3 10*3/uL (ref 0.7–4.0)
MCH: 31.4 pg (ref 26.0–34.0)
MCHC: 32.4 g/dL (ref 30.0–36.0)
MCV: 96.8 fL (ref 78.0–100.0)
Monocytes Absolute: 0.4 10*3/uL (ref 0.1–1.0)
Monocytes Relative: 7 %
NEUTROS ABS: 4.2 10*3/uL (ref 1.7–7.7)
NEUTROS PCT: 70 %
Platelets: 227 10*3/uL (ref 150–400)
RBC: 4.11 MIL/uL (ref 3.87–5.11)
RDW: 14 % (ref 11.5–15.5)
WBC: 5.9 10*3/uL (ref 4.0–10.5)

## 2016-08-29 LAB — BASIC METABOLIC PANEL
ANION GAP: 7 (ref 5–15)
BUN: 22 mg/dL — ABNORMAL HIGH (ref 6–20)
CALCIUM: 8.6 mg/dL — AB (ref 8.9–10.3)
CO2: 24 mmol/L (ref 22–32)
Chloride: 110 mmol/L (ref 101–111)
Creatinine, Ser: 0.77 mg/dL (ref 0.44–1.00)
Glucose, Bld: 95 mg/dL (ref 65–99)
Potassium: 4 mmol/L (ref 3.5–5.1)
Sodium: 141 mmol/L (ref 135–145)

## 2016-08-29 NOTE — ED Provider Notes (Signed)
[facility] DEPT Provider Note   CSN: [id number] Arrival date & time: 08/29/16  1211     History   Chief Complaint Chief Complaint  Patient presents with  . Tachycardia    HPI Gabriella Jackson is a 83 y.o. female.  Pt is an 83yo female with hx of SVT, ITP, DJD who presents from a PCP office with tachycardia.  She has had a 3 day hx of worsening pain to her left knee.  It hurts worse to ambulate on it.  The daughter says that it has been swollen behind the knee and that she is concerned about a possible blood clot.  She went to see her PCP today and there was found to have an elevated HR and sent her for further evaluation.  She denies any palpitations.  No CP or SOB.  No fevers.  No recent illnesses.      Past Medical History:  Diagnosis Date  . Colon cancer ([place]) 2002   s/p colonic resection- chemotherapy and surgery  . Depressed   . DJD (degenerative joint disease)   . Dysrhythmia    hx SVT  . Fuchs' corneal dystrophy 2002  . Hernia, inguinal, right   . ITP (idiopathic thrombocytopenic purpura)    s/p splenectomy- remission  . Osteopenia   . PONV (postoperative nausea and vomiting)   . SVT (supraventricular tachycardia) ([place])   . Urticaria     Patient Active Problem List   Diagnosis Date Noted  . S/P right THA, AA 11/01/2014  . Arthritis of right hip 02/25/2014  . SVT (supraventricular tachycardia) ([place]) 01/27/2013    Past Surgical History:  Procedure Laterality Date  . ABDOMINAL HYSTERECTOMY    . COLON SURGERY    . EYE SURGERY     corneal replacements and cataracts removed.  Marland Kitchen FOOT SURGERY     Foreign body" imbedded sewing needle"  . ROTATOR CUFF REPAIR Right   . SMALL INTESTINE SURGERY    . SPLENECTOMY  1999  . TOTAL HIP ARTHROPLASTY Right 11/01/2014   Procedure: RIGHT TOTAL HIP ARTHROPLASTY ANTERIOR APPROACH;  Surgeon: Paralee Cancel, MD;  Location: [facility];  Service: Orthopedics;  Laterality: Right;    OB History    No data available        Home Medications    Prior to Admission medications   Medication Sig Start Date End Date Taking? Authorizing Provider  CALCIUM PO Take 1 tablet by mouth daily.   Yes [provider]  Cholecalciferol (VITAMIN D3 PO) Take 1 capsule by mouth daily.   Yes [provider]  diltiazem (CARDIZEM CD) 120 MG 24 hr capsule Take 1 capsule (120 mg total) by mouth daily. 04/19/16 08/29/16 Yes Allred, Jeneen Rinks, MD  loratadine (CLARITIN) 10 MG tablet Take 10 mg by mouth daily as needed.    Yes [provider]  MELATONIN PO Take 1 tablet by mouth at bedtime as needed (for sleep).   Yes [provider]  mirtazapine (REMERON) 15 MG tablet Take 15 mg by mouth at bedtime. 09/07/15  Yes [provider]    Family History Family History  Problem Relation Age of Onset  . Cancer Mother   . Stroke Father   . Heart disease Father     Social History Social History  Substance Use Topics  . Smoking status: Never Smoker  . Smokeless tobacco: Never Used  . Alcohol use 3.5 oz/week    7 Standard drinks or equivalent per week     Comment: small  glass of wine each day     Allergies   Codeine and Sulfa antibiotics   Review of Systems Review of Systems  Constitutional: Negative for chills, diaphoresis, fatigue and fever.  HENT: Negative for congestion, rhinorrhea and sneezing.   Eyes: Negative.   Respiratory: Negative for cough, chest tightness and shortness of breath.   Cardiovascular: Positive for leg swelling. Negative for chest pain.  Gastrointestinal: Negative for abdominal pain, blood in stool, diarrhea, nausea and vomiting.  Genitourinary: Negative for difficulty urinating, flank pain, frequency and hematuria.  Musculoskeletal: Positive for arthralgias. Negative for back pain.  Skin: Negative for rash.  Neurological: Negative for dizziness, speech difficulty, weakness, numbness and headaches.     Physical Exam Updated Vital Signs BP 128/64   Pulse 97    Temp 98.7 F (37.1 C) (Oral)   Resp 19   Ht 5\' 3"  (1.6 m)   Wt 48.1 kg (106 lb)   SpO2 99%   BMI 18.78 kg/m   Physical Exam  Constitutional: She is oriented to person, place, and time. She appears well-developed and well-nourished.  HENT:  Head: Normocephalic and atraumatic.  Eyes: Pupils are equal, round, and reactive to light.  Neck: Normal range of motion. Neck supple.  Cardiovascular: Normal rate, regular rhythm and normal heart sounds.   Pulmonary/Chest: Effort normal and breath sounds normal. No respiratory distress. She has no wheezes. She has no rales. She exhibits no tenderness.  Abdominal: Soft. Bowel sounds are normal. There is no tenderness. There is no rebound and no guarding.  Musculoskeletal: Normal range of motion. She exhibits no edema.  Mild pain on palpaton and ROM of the left knee.  +tenderness as well to the posterior left knee.  No swelling to the calf or ankle.  No pain to hip or ankle.  Pedal pulses intact  Lymphadenopathy:    She has no cervical adenopathy.  Neurological: She is alert and oriented to person, place, and time.  Skin: Skin is warm and dry. No rash noted.  Psychiatric: She has a normal mood and affect.     ED Treatments / Results  Labs (all labs ordered are listed, but only abnormal results are displayed) Labs Reviewed  BASIC METABOLIC PANEL - Abnormal; Notable for the following:       Result Value   BUN 22 (*)    Calcium 8.6 (*)    All other components within normal limits  CBC WITH DIFFERENTIAL/PLATELET    EKG  EKG Interpretation  Date/Time:  Thursday Aug 29 2016 12:39:08 EDT Ventricular Rate:  96 PR Interval:    QRS Duration: 85 QT Interval:  389 QTC Calculation: 492 R Axis:   64 Text Interpretation:  Sinus rhythm Ventricular premature complex Borderline prolonged QT interval since last tracing no significant change Confirmed by Malvin Johns [phone]) on 08/29/2016 2:45:55 PM       Radiology Dg Knee Complete 4 Views  Left  Result Date: 08/29/2016 CLINICAL DATA:  83 year old female with generalized left knee pain for 4 days, no known injury. EXAM: LEFT KNEE - COMPLETE 4+ VIEW COMPARISON:  None. FINDINGS: Mild osteopenia for age. No joint effusion. Mild for age medial compartment osteophytosis. Joint spaces are relatively maintained. Patella is intact. No acute osseous abnormality identified. IMPRESSION: No acute osseous abnormality identified and mild for age degenerative osseous changes at the left knee. Electronically Signed   By: Genevie Ann M.D.   On: 08/29/2016 14:32    Procedures Procedures (including critical care time)  Medications Ordered  in ED Medications - No data to display   Initial Impression / Assessment and Plan / ED Course  I have reviewed the triage vital signs and the nursing notes.  Pertinent labs & imaging results that were available during my care of the patient were reviewed by me and considered in my medical decision making (see chart for details).     Patient presents with left knee pain. However when she was at her PCPs office today she had an episode of A. fib with RVR. I contacted Mandy, the NP who saw the pt in the office who said that when pt arrived, she was in a-fib with RVR.  I was able to review the office EKG as well. Patient was in a sinus rhythm by the time of the EMS evaluation and has remained in a sinus rhythm throughout the ED course. Patient states that she's been a little bit more fatigued over the last 3 weeks or so which she terms to the heat. She denies any dizziness or lightheadedness. She denies any recent syncopal events. She denies any chest pain shortness of breath or palpitations. She has been seen by Dr. [name] in the past. I did advise her and her family member who is here that patient needs to have close follow-up with her cardiologist. She was also given strict return precautions. As far as her knee pain I feel that this is likely degenerative joint disease.  There is no evidence of infection. No effusion. No DVT. Her x-ray is consistent with degenerative joint changes. Given her fatigue, I did check some labs which are unremarkable. She doesn't have any fever or other suggestions of a UTI.  Final Clinical Impressions(s) / ED Diagnoses   Final diagnoses:  Acute pain of left knee  Paroxysmal atrial fibrillation [facility])    New Prescriptions New Prescriptions   No medications on file     Malvin Johns, MD 08/29/16 1525

## 2016-08-29 NOTE — ED Triage Notes (Signed)
Patient arrived to ED via [facility] from [facility] Physician's office. Patient from home. Had gone to PCP for L knee pain. Patient noted to be experiencing intermittent tachycardia. HR 100, then jumps to 160s "for a couple of minutes", and then decreases back to 100. Denies chest pain or dizziness. EMS called. 18 gauge in L FA. Fluid bolus 500 when complete.  Cardiologist is Dr. [name].  BP 142/90, Pulse 100-160, Resp 16, 98% on room air. CBG 112.

## 2016-08-29 NOTE — Telephone Encounter (Signed)
Patient daughter Manuela Schwartz) calling, states that patient was seen in ED today [phone] and was instructed to follow up with Dr. [name] within the next two days. Dr. [name] did not have availability so I offered an appt with PA, the earliest appt was for Tuesday 09-03-16. Patient daughter stated that if " you feel like its not an emergency and that it can wait, then we an come in on Wednesday." I informed patient that I am not clinical and that I would pass her message to clinical staff. Please call to discuss, thanks.

## 2016-08-29 NOTE — Telephone Encounter (Signed)
Discussed with Chanetta Marshall, NP and we can restart the Flecainide if needed.  I called and spoke with daughter and she says that her mom could not feel it when her heart was racing.  But she feels like her HR was up due to pain and walking into the MD's office.  She is going to monitor her over the weekend and I will touch base with her daughter on Wed after talking to Dr [name].

## 2016-08-29 NOTE — Progress Notes (Signed)
Preliminary results by tech - Left Lower Ext. Venous Duplex Completed. Negative for deep and superficial vein thrombosis.  Zahli Vetsch, BS, RDMS, RVT  

## 2016-09-06 NOTE — Telephone Encounter (Signed)
Discussed with Dr [name] and he does not want to make any changes.  Spoke to patients daughter and she feels like she would like for her to be seen.  I have scheduled an appointment for her.

## 2016-09-30 DIAGNOSIS — M25562 Pain in left knee: Secondary | ICD-10-CM | POA: Diagnosis not present

## 2016-10-02 ENCOUNTER — Ambulatory Visit (INDEPENDENT_AMBULATORY_CARE_PROVIDER_SITE_OTHER): Payer: Medicare Other | Admitting: Internal Medicine

## 2016-10-02 ENCOUNTER — Encounter: Payer: Self-pay | Admitting: Internal Medicine

## 2016-10-02 VITALS — BP 110/60 | HR 99 | Ht 64.0 in | Wt 107.0 lb

## 2016-10-02 DIAGNOSIS — I480 Paroxysmal atrial fibrillation: Secondary | ICD-10-CM

## 2016-10-02 DIAGNOSIS — I471 Supraventricular tachycardia: Secondary | ICD-10-CM | POA: Diagnosis not present

## 2016-10-02 NOTE — Patient Instructions (Signed)
Medication Instructions:  Your physician recommends that you continue on your current medications as directed. Please refer to the Current Medication list given to you today.   Labwork: None ordered   Testing/Procedures: None ordered   Follow-Up: Your physician recommends that you schedule a follow-up appointment in: 4 months with Roderic Palau, NP and 12 months with Dr [name]   Any Other Special Instructions Will Be Listed Below (If Applicable).     If you need a refill on your cardiac medications before your next appointment, please call your pharmacy.

## 2016-10-02 NOTE — Progress Notes (Signed)
PCP: Lajean Manes, MD  Gabriella Jackson is a 83 y.o. female who presents today for routine electrophysiology followup.  Since last being seen in our clinic, the patient reports doing reasonably well.  She presented to pcp 08/29/16 and was noted to have afib with RVR.  She was evaluated in the ER but had converted to sinus by arrival.  She is unaware of any further episodes.   Her primary concern today is with L knee pain.  Today, she denies symptoms of palpitations, chest pain, shortness of breath,  lower extremity edema, dizziness, presyncope, or syncope.  The patient is otherwise without complaint today.   Past Medical History:  Diagnosis Date  . Colon cancer ([place]) 2002   s/p colonic resection- chemotherapy and surgery  . Depressed   . DJD (degenerative joint disease)   . Fuchs' corneal dystrophy 2002  . Hernia, inguinal, right   . ITP (idiopathic thrombocytopenic purpura)    s/p splenectomy- remission  . Osteopenia   . Paroxysmal atrial fibrillation ([facility])    hx SVT  . PONV (postoperative nausea and vomiting)   . SVT (supraventricular tachycardia) ([place])   . Urticaria    Past Surgical History:  Procedure Laterality Date  . ABDOMINAL HYSTERECTOMY    . COLON SURGERY    . EYE SURGERY     corneal replacements and cataracts removed.  Marland Kitchen FOOT SURGERY     Foreign body" imbedded sewing needle"  . ROTATOR CUFF REPAIR Right   . SMALL INTESTINE SURGERY    . SPLENECTOMY  1999  . TOTAL HIP ARTHROPLASTY Right 11/01/2014   Procedure: RIGHT TOTAL HIP ARTHROPLASTY ANTERIOR APPROACH;  Surgeon: Paralee Cancel, MD;  Location: [facility];  Service: Orthopedics;  Laterality: Right;    ROS- all systems are reviewed and negatives except as per HPI above  Current Outpatient Prescriptions  Medication Sig Dispense Refill  . CALCIUM PO Take 1 tablet by mouth daily.    . Cholecalciferol (VITAMIN D3 PO) Take 1 capsule by mouth daily.    Marland Kitchen diltiazem (CARDIZEM CD) 120 MG 24 hr capsule Take 1 capsule (120  mg total) by mouth daily. 90 capsule 1  . loratadine (CLARITIN) 10 MG tablet Take 10 mg by mouth daily as needed for allergies.     Marland Kitchen MELATONIN PO Take 1 tablet by mouth at bedtime as needed (for sleep).    . mirtazapine (REMERON) 15 MG tablet Take 15 mg by mouth at bedtime.  0   No current facility-administered medications for this visit.     Physical Exam: Vitals:   10/02/16 1050  BP: 110/60  Pulse: 99  SpO2: 97%  Weight: 107 lb (48.5 kg)  Height: 5\' 4"  (1.626 m)    GEN- The patient is well appearing, alert and oriented x 3 today.   Head- normocephalic, atraumatic Eyes-  Sclera clear, conjunctiva pink Ears- hearing intact Oropharynx- clear Lungs- Clear to ausculation bilaterally, normal work of breathing Heart- Regular rate and rhythm, no murmurs, rubs or gallops, PMI not laterally displaced GI- soft, NT, ND, + BS Extremities- no clubbing, cyanosis, or edema  EKG tracing ordered today is personally reviewed and shows sinus rhythm 99 bpm, PVCs  Assessment and Plan:  1. Paroxysmal atrial fibrillation Recently diagnosed chads2vasc score is at least 3.  I have advised initiation of anticoagulation. She is clear in her decision to decline.  Given her advanced age, this may be reasonable.  Would not advise AAD therapy with just a single episode of afib.  Can titrate cardizem as needed.  Follow-up in the afib clinic in 4 months I will see in a year unless problems arise in the interim  Thompson Grayer MD, [facility] 10/02/2016 11:13 AM

## 2016-10-04 DIAGNOSIS — M1711 Unilateral primary osteoarthritis, right knee: Secondary | ICD-10-CM | POA: Diagnosis not present

## 2016-11-09 ENCOUNTER — Other Ambulatory Visit: Payer: Self-pay | Admitting: Internal Medicine

## 2016-11-13 DIAGNOSIS — I471 Supraventricular tachycardia: Secondary | ICD-10-CM | POA: Diagnosis not present

## 2016-11-13 DIAGNOSIS — F325 Major depressive disorder, single episode, in full remission: Secondary | ICD-10-CM | POA: Diagnosis not present

## 2016-11-13 DIAGNOSIS — F419 Anxiety disorder, unspecified: Secondary | ICD-10-CM | POA: Diagnosis not present

## 2016-11-13 DIAGNOSIS — Z0000 Encounter for general adult medical examination without abnormal findings: Secondary | ICD-10-CM | POA: Diagnosis not present

## 2016-11-14 ENCOUNTER — Ambulatory Visit
Admission: RE | Admit: 2016-11-14 | Discharge: 2016-11-14 | Disposition: A | Discharge status: Home / Self Care | Payer: Medicare Other | Source: Ambulatory Visit | Attending: Geriatric Medicine | Admitting: Geriatric Medicine

## 2016-11-14 ENCOUNTER — Other Ambulatory Visit: Payer: Self-pay | Admitting: Geriatric Medicine

## 2016-11-14 DIAGNOSIS — R059 Cough, unspecified: Secondary | ICD-10-CM

## 2016-11-14 DIAGNOSIS — R05 Cough: Secondary | ICD-10-CM

## 2017-01-04 ENCOUNTER — Encounter (HOSPITAL_COMMUNITY): Payer: Self-pay | Admitting: *Deleted

## 2017-01-04 ENCOUNTER — Emergency Department (HOSPITAL_COMMUNITY)
Admission: EM | Admit: 2017-01-04 | Discharge: 2017-01-04 | Disposition: A | Discharge status: Home / Self Care | Payer: Medicare Other | Attending: Emergency Medicine | Admitting: Emergency Medicine

## 2017-01-04 DIAGNOSIS — Z79899 Other long term (current) drug therapy: Secondary | ICD-10-CM | POA: Insufficient documentation

## 2017-01-04 DIAGNOSIS — T7840XA Allergy, unspecified, initial encounter: Secondary | ICD-10-CM | POA: Insufficient documentation

## 2017-01-04 DIAGNOSIS — I4891 Unspecified atrial fibrillation: Secondary | ICD-10-CM | POA: Insufficient documentation

## 2017-01-04 DIAGNOSIS — R220 Localized swelling, mass and lump, head: Secondary | ICD-10-CM

## 2017-01-04 DIAGNOSIS — R600 Localized edema: Secondary | ICD-10-CM | POA: Diagnosis present

## 2017-01-04 MED ORDER — PREDNISONE 20 MG PO TABS
60.0000 mg | ORAL_TABLET | Freq: Once | ORAL | Status: AC
  Administered 2017-01-04: 60 mg via ORAL
  Filled 2017-01-04: qty 3

## 2017-01-04 MED ORDER — PREDNISONE 20 MG PO TABS: 40.0000 mg | ORAL_TABLET | Freq: Every day | ORAL | 0 refills | Status: AC

## 2017-01-04 MED ORDER — FAMOTIDINE 20 MG PO TABS
20.0000 mg | ORAL_TABLET | Freq: Once | ORAL | Status: AC
  Administered 2017-01-04: 20 mg via ORAL
  Filled 2017-01-04: qty 1

## 2017-01-04 MED ORDER — EPINEPHRINE 0.15 MG/0.15ML IJ SOAJ: 0.1500 mg | INTRAMUSCULAR | 0 refills | Status: DC | PRN

## 2017-01-04 NOTE — ED Notes (Signed)
Bed: WA23 Expected date:  Expected time:  Means of arrival:  Comments: triage 

## 2017-01-04 NOTE — ED Triage Notes (Signed)
Pt states she has intermittent swelling that flares at times involving her face, ate shrimp last night and noticed swelling on left face, today both sides and lips, no airway involvment

## 2017-01-04 NOTE — ED Provider Notes (Signed)
[facility] DEPT Provider Note   CSN: [id number] Arrival date & time: 01/04/17  0831     History   Chief Complaint Chief Complaint  Patient presents with  . Facial Swelling    HPI Gabriella Jackson is a 83 y.o. female.  Gabriella Jackson is a 83 y.o. Female A. Fib, ITP, depression, who presents with facial swelling. Patient states she had shrimp for dinner at a restaurant last night and a few hours later she noticed some swelling on the left side of her face she put an ice pack on it and went to bed when she woke up she had swelling on both sides of the face. Pt took 2 benadryl PTA. Pt denies pain or redness, no rashes elsewhere. No difficulty breathing, or sensation of throat closing. Pt reports this has happened in the past, but usually only one sided, and she has seen her PCP many times. Swelling usually resolves with benadryl and pepcid. Pt has had allergy testing with no clear results, and no pattern has been found for what triggers this swelling, reaction has never progressed to anaphylaxis.       Past Medical History:  Diagnosis Date  . Colon cancer ([place]) 2002   s/p colonic resection- chemotherapy and surgery  . Depressed   . DJD (degenerative joint disease)   . Fuchs' corneal dystrophy 2002  . Hernia, inguinal, right   . ITP (idiopathic thrombocytopenic purpura)    s/p splenectomy- remission  . Osteopenia   . Paroxysmal atrial fibrillation (HCC)    hx SVT  . PONV (postoperative nausea and vomiting)   . SVT (supraventricular tachycardia) ([place])   . Urticaria     Patient Active Problem List   Diagnosis Date Noted  . S/P right THA, AA 11/01/2014  . Arthritis of right hip 02/25/2014  . SVT (supraventricular tachycardia) ([place]) 01/27/2013    Past Surgical History:  Procedure Laterality Date  . ABDOMINAL HYSTERECTOMY    . COLON SURGERY    . EYE SURGERY     corneal replacements and cataracts removed.  Marland Kitchen ROTATOR CUFF REPAIR Right   . SMALL INTESTINE SURGERY     . SPLENECTOMY  1999  . TOTAL HIP ARTHROPLASTY Right 11/01/2014   Procedure: RIGHT TOTAL HIP ARTHROPLASTY ANTERIOR APPROACH;  Surgeon: Paralee Cancel, MD;  Location: [facility];  Service: Orthopedics;  Laterality: Right;    OB History    No data available       Home Medications    Prior to Admission medications   Medication Sig Start Date End Date Taking? Authorizing Provider  CALCIUM PO Take 1 tablet by mouth daily.   Yes [provider]  CARTIA XT 120 MG 24 hr capsule TAKE ONE CAPSULE BY MOUTH ONCE DAILY 11/11/16  Yes Allred, Jeneen Rinks, MD  Cholecalciferol (VITAMIN D3 PO) Take 1 capsule by mouth daily.   Yes [provider]  diphenhydrAMINE (BENADRYL) 25 MG tablet Take 25-50 mg by mouth every 6 (six) hours as needed for allergies.   Yes [provider]  loratadine (CLARITIN) 10 MG tablet Take 10 mg by mouth daily as needed for allergies.    Yes [provider]  MELATONIN PO Take 1 tablet by mouth at bedtime as needed (for sleep).   Yes [provider]  mirtazapine (REMERON) 15 MG tablet Take 15 mg by mouth at bedtime. 09/07/15  Yes [provider]    Family History Family History  Problem Relation Age of Onset  . Cancer Mother   .  Stroke Father   . Heart disease Father     Social History Social History  Substance Use Topics  . Smoking status: Never Smoker  . Smokeless tobacco: Never Used  . Alcohol use 3.5 oz/week    7 Standard drinks or equivalent per week     Comment: small glass of wine each day     Allergies   Codeine; Aspirin; Campho-phenique [phenol]; Ciprofloxacin; Nsaids; and Sulfa antibiotics   Review of Systems Review of Systems  Constitutional: Negative for chills and fever.  HENT: Positive for facial swelling. Negative for congestion, dental problem, drooling, rhinorrhea, sore throat and trouble swallowing.   Eyes: Negative for visual disturbance.  Respiratory: Negative for cough, chest tightness and shortness  of breath.   Cardiovascular: Negative for chest pain and palpitations.  Gastrointestinal: Negative for abdominal pain, nausea and vomiting.  Genitourinary: Negative for dysuria.  Musculoskeletal: Negative for arthralgias and joint swelling.  Skin: Negative for pallor and rash.  Neurological: Negative for dizziness, weakness and light-headedness.     Physical Exam Updated Vital Signs BP 134/80   Pulse 96   Temp 97.6 F (36.4 C) (Oral)   Resp 13   Ht 5\' 3"  (1.6 m)   Wt 48.5 kg (107 lb)   SpO2 95%   BMI 18.95 kg/m   Physical Exam  Constitutional: She appears well-developed and well-nourished. No distress.  HENT:  Head: Normocephalic and atraumatic.  Swelling of bilateral cheeks with some lip involvement, no involvement of the tongue, NTTP, no erythema, no evidence of dental abscess, posterior oropharynx clear and moist. No trismus, tolerating secretions  Eyes: Pupils are equal, round, and reactive to light. EOM are normal. Right eye exhibits no discharge. Left eye exhibits no discharge.  Cardiovascular: Normal rate, regular rhythm, normal heart sounds and intact distal pulses.   Pulmonary/Chest: Effort normal and breath sounds normal. No respiratory distress. She has no wheezes. She has no rales.  Good air movement bilaterally, no evidence or respiratory distress, no stridor  Abdominal: Soft. Bowel sounds are normal. There is no tenderness. There is no guarding.  Musculoskeletal: She exhibits no edema.  Neurological: She is alert. Coordination normal.  Skin: Skin is warm and dry. No rash noted. She is not diaphoretic.  Psychiatric: She has a normal mood and affect. Her behavior is normal.  Nursing note and vitals reviewed.    ED Treatments / Results  Labs (all labs ordered are listed, but only abnormal results are displayed) Labs Reviewed - No data to display  EKG  EKG Interpretation None       Radiology No results found.  Procedures Procedures (including  critical care time)  Medications Ordered in ED Medications - No data to display   Initial Impression / Assessment and Plan / ED Course  I have reviewed the triage vital signs and the nursing notes.  Pertinent labs & imaging results that were available during my care of the patient were reviewed by me and considered in my medical decision making (see chart for details).  Pt presents with facial swelling, no involvement of tongue. Pt in NAD, no evidence of respiratory distress or airway compromise, vitals normal. Likely an allergic reaction. History of this happening in the past with no clear trigger, but worse than previous today. Lungs clear on exam. Will give prednisone and famotidine and monitor.  No progression of swelling while in the ED. Pt is stable for discharge home with short course of steroids and prescription for EpiPen. Pt to follow up  with PCP. Return precautions provided.  Patient discussed with Dr. Sherry Ruffing, who saw patient as well and agrees with plan.  Final Clinical Impressions(s) / ED Diagnoses   Final diagnoses:  Allergic reaction, initial encounter  Facial swelling    New Prescriptions Discharge Medication List as of 01/04/2017 12:30 PM    START taking these medications   Details  EPINEPHrine 0.15 MG/0.15ML IJ injection Inject 0.15 mLs (0.15 mg total) into the muscle as needed for anaphylaxis., Starting Sat 01/04/2017, Print    predniSONE (DELTASONE) 20 MG tablet Take 2 tablets (40 mg total) by mouth daily., Starting Sat 01/04/2017, Until Wed 01/08/2017, Print         Jacqlyn Larsen, PA-C 01/04/17 2014    Tegeler, Gwenyth Allegra, MD 01/06/17 [phone]

## 2017-01-04 NOTE — Discharge Instructions (Signed)
Please complete course of steroids as prescribed. Follow-up with your primary care provider. Return to the emergency department if you have difficulty breathing progression of facial swelling or other new or concerning symptoms develop.  Get help right away if: You had to use your auto-injector pen. You must go to the emergency room even if the medicine seems to be working. You have any of these: A tight feeling in your chest or your throat. Loud breathing. Trouble with breathing. Itchy, red, swollen areas of skin. Red skin or itching all over your body. Swelling in your lips, tongue, or the back of your throat. You have throwing up that gets very bad. You have watery poop that gets very bad. You pass out or feel like you might pass out.

## 2017-01-15 DIAGNOSIS — R413 Other amnesia: Secondary | ICD-10-CM | POA: Diagnosis not present

## 2017-01-15 DIAGNOSIS — E46 Unspecified protein-calorie malnutrition: Secondary | ICD-10-CM | POA: Diagnosis not present

## 2017-02-05 ENCOUNTER — Ambulatory Visit (HOSPITAL_COMMUNITY)
Admission: RE | Admit: 2017-02-05 | Discharge: 2017-02-05 | Disposition: A | Discharge status: Home / Self Care | Payer: Medicare Other | Source: Ambulatory Visit | Attending: Nurse Practitioner | Admitting: Nurse Practitioner

## 2017-02-05 ENCOUNTER — Encounter (HOSPITAL_COMMUNITY): Payer: Self-pay | Admitting: Nurse Practitioner

## 2017-02-05 VITALS — BP 132/60 | HR 97 | Ht 63.0 in | Wt 110.4 lb

## 2017-02-05 DIAGNOSIS — Z885 Allergy status to narcotic agent status: Secondary | ICD-10-CM | POA: Insufficient documentation

## 2017-02-05 DIAGNOSIS — M8580 Other specified disorders of bone density and structure, unspecified site: Secondary | ICD-10-CM | POA: Diagnosis not present

## 2017-02-05 DIAGNOSIS — Z882 Allergy status to sulfonamides status: Secondary | ICD-10-CM | POA: Insufficient documentation

## 2017-02-05 DIAGNOSIS — Z881 Allergy status to other antibiotic agents status: Secondary | ICD-10-CM | POA: Diagnosis not present

## 2017-02-05 DIAGNOSIS — Z79899 Other long term (current) drug therapy: Secondary | ICD-10-CM | POA: Insufficient documentation

## 2017-02-05 DIAGNOSIS — Z85038 Personal history of other malignant neoplasm of large intestine: Secondary | ICD-10-CM | POA: Diagnosis not present

## 2017-02-05 DIAGNOSIS — Z8249 Family history of ischemic heart disease and other diseases of the circulatory system: Secondary | ICD-10-CM | POA: Diagnosis not present

## 2017-02-05 DIAGNOSIS — Z9889 Other specified postprocedural states: Secondary | ICD-10-CM | POA: Diagnosis not present

## 2017-02-05 DIAGNOSIS — Z886 Allergy status to analgesic agent status: Secondary | ICD-10-CM | POA: Diagnosis not present

## 2017-02-05 DIAGNOSIS — Z823 Family history of stroke: Secondary | ICD-10-CM | POA: Diagnosis not present

## 2017-02-05 DIAGNOSIS — I471 Supraventricular tachycardia: Secondary | ICD-10-CM | POA: Diagnosis not present

## 2017-02-05 DIAGNOSIS — Z809 Family history of malignant neoplasm, unspecified: Secondary | ICD-10-CM | POA: Insufficient documentation

## 2017-02-05 DIAGNOSIS — Z90710 Acquired absence of both cervix and uterus: Secondary | ICD-10-CM | POA: Insufficient documentation

## 2017-02-05 DIAGNOSIS — Z9081 Acquired absence of spleen: Secondary | ICD-10-CM | POA: Diagnosis not present

## 2017-02-05 DIAGNOSIS — Z888 Allergy status to other drugs, medicaments and biological substances status: Secondary | ICD-10-CM | POA: Diagnosis not present

## 2017-02-05 DIAGNOSIS — Z96641 Presence of right artificial hip joint: Secondary | ICD-10-CM | POA: Insufficient documentation

## 2017-02-05 DIAGNOSIS — Z9221 Personal history of antineoplastic chemotherapy: Secondary | ICD-10-CM | POA: Insufficient documentation

## 2017-02-05 DIAGNOSIS — I480 Paroxysmal atrial fibrillation: Secondary | ICD-10-CM | POA: Diagnosis not present

## 2017-02-05 DIAGNOSIS — M1990 Unspecified osteoarthritis, unspecified site: Secondary | ICD-10-CM | POA: Insufficient documentation

## 2017-02-05 NOTE — Progress Notes (Signed)
Primary Care Physician: Lajean Manes, MD Referring Physician: [name] is a 83 y.o. female with a h/o recently diagnose afib that had been placed on flecainide. She was seen by Dr. [name] and flecaine was stopped and anticoagulation was discussed. Dr.Allred wanted  her to start for a chadsvasc score of 3 but pt was clear in her decision to decline anticoagulation. She is in SR today and is never aware if her heart rate is fast or irregular.  Today, she denies symptoms of palpitations, chest pain, shortness of breath, orthopnea, PND, lower extremity edema, dizziness, presyncope, syncope, or neurologic sequela. The patient is tolerating medications without difficulties and is otherwise without complaint today.   Past Medical History:  Diagnosis Date  . Colon cancer ([place]) 2002   s/p colonic resection- chemotherapy and surgery  . Depressed   . DJD (degenerative joint disease)   . Fuchs' corneal dystrophy 2002  . Hernia, inguinal, right   . ITP (idiopathic thrombocytopenic purpura)    s/p splenectomy- remission  . Osteopenia   . Paroxysmal atrial fibrillation (HCC)    hx SVT  . PONV (postoperative nausea and vomiting)   . SVT (supraventricular tachycardia) ([place])   . Urticaria    Past Surgical History:  Procedure Laterality Date  . ABDOMINAL HYSTERECTOMY    . COLON SURGERY    . EYE SURGERY     corneal replacements and cataracts removed.  Marland Kitchen ROTATOR CUFF REPAIR Right   . SMALL INTESTINE SURGERY    . SPLENECTOMY  1999  . TOTAL HIP ARTHROPLASTY Right 11/01/2014   Procedure: RIGHT TOTAL HIP ARTHROPLASTY ANTERIOR APPROACH;  Surgeon: Paralee Cancel, MD;  Location: [facility];  Service: Orthopedics;  Laterality: Right;    Current Outpatient Prescriptions  Medication Sig Dispense Refill  . CALCIUM PO Take 1 tablet by mouth daily.    Marland Kitchen CARTIA XT 120 MG 24 hr capsule TAKE ONE CAPSULE BY MOUTH ONCE DAILY 90 capsule 2  . Cholecalciferol (VITAMIN D3 PO) Take 1 capsule by  mouth daily.    . diphenhydrAMINE (BENADRYL) 25 MG tablet Take 25-50 mg by mouth every 6 (six) hours as needed for allergies.    Marland Kitchen loratadine (CLARITIN) 10 MG tablet Take 10 mg by mouth daily as needed for allergies.     Marland Kitchen MELATONIN PO Take 1 tablet by mouth at bedtime as needed (for sleep).    . mirtazapine (REMERON) 15 MG tablet Take 15 mg by mouth at bedtime.  0   No current facility-administered medications for this encounter.     Allergies  Allergen Reactions  . Codeine Nausea Only  . Aspirin     ITP  . Campho-Phenique [Phenol]     unknown  . Ciprofloxacin Hives  . Nsaids     Lower BP  . Sulfa Antibiotics     Cannot remember    Social History   Social History  . Marital status: Widowed    Spouse name: N/A  . Number of children: N/A  . Years of education: N/A   Occupational History  . Not on file.   Social History Main Topics  . Smoking status: Never Smoker  . Smokeless tobacco: Never Used  . Alcohol use 3.5 oz/week    7 Standard drinks or equivalent per week     Comment: small glass of wine each day  . Drug use: No  . Sexual activity: No   Other Topics Concern  . Not on file   Social  History Narrative   Lives in [place] alone.   Retired Network engineer          Family History  Problem Relation Age of Onset  . Cancer Mother   . Stroke Father   . Heart disease Father     ROS- All systems are reviewed and negative except as per the HPI above  Physical Exam: Vitals:   02/05/17 1343  BP: 132/60  Pulse: 97  Weight: 110 lb 6.4 oz (50.1 kg)  Height: 5\' 3"  (1.6 m)   Wt Readings from Last 3 Encounters:  02/05/17 110 lb 6.4 oz (50.1 kg)  01/04/17 107 lb (48.5 kg)  10/02/16 107 lb (48.5 kg)    Labs: Lab Results  Component Value Date   NA 141 08/29/2016   K 4.0 08/29/2016   CL 110 08/29/2016   CO2 24 08/29/2016   GLUCOSE 95 08/29/2016   BUN 22 (H) 08/29/2016   CREATININE 0.77 08/29/2016   CALCIUM 8.6 (L) 08/29/2016   Lab Results  Component  Value Date   INR 0.95 10/26/2014   No results found for: CHOL, HDL, LDLCALC, TRIG   GEN- The patient is well appearing, alert and oriented x 3 today.   Head- normocephalic, atraumatic Eyes-  Sclera clear, conjunctiva pink Ears- hearing intact Oropharynx- clear Neck- supple, no JVP Lymph- no cervical lymphadenopathy Lungs- Clear to ausculation bilaterally, normal work of breathing Heart- Regular rate and rhythm, no murmurs, rubs or gallops, PMI not laterally displaced GI- soft, NT, ND, + BS Extremities- no clubbing, cyanosis, or edema MS- no significant deformity or atrophy Skin- no rash or lesion Psych- euthymic mood, full affect Neuro- strength and sensation are intact  EKG- SR with PVC's, PAC's, Pr int 164 ms, qrs int 72 ms, qtc 459 ms Epic records reviewed    Assessment and Plan: 1. Paroxysmal afib In SR and has noted any heart irregularity  Still declines anticoagulation for chadsvasc score of 3 Continue cartia 120 mg a day  F/u with Dr. [name] June 2019, afib clinic as needed  Geroge Baseman. Idella Lamontagne, [facility] [address] [place], [place] [address] [phone]

## 2017-02-18 NOTE — Addendum Note (Signed)
Encounter addended by: Sherran Needs, NP on: 02/18/2017 8:41 AM  Actions taken: LOS modified

## 2017-07-04 DIAGNOSIS — I471 Supraventricular tachycardia: Secondary | ICD-10-CM | POA: Diagnosis not present

## 2017-07-04 DIAGNOSIS — R600 Localized edema: Secondary | ICD-10-CM | POA: Diagnosis not present

## 2017-07-09 ENCOUNTER — Encounter: Payer: Self-pay | Admitting: Internal Medicine

## 2017-07-09 ENCOUNTER — Ambulatory Visit: Payer: Medicare Other | Admitting: Internal Medicine

## 2017-07-09 VITALS — BP 118/62 | HR 129 | Ht 63.0 in | Wt 111.0 lb

## 2017-07-09 DIAGNOSIS — I480 Paroxysmal atrial fibrillation: Secondary | ICD-10-CM | POA: Diagnosis not present

## 2017-07-09 DIAGNOSIS — I5031 Acute diastolic (congestive) heart failure: Secondary | ICD-10-CM

## 2017-07-09 MED ORDER — DILTIAZEM HCL ER COATED BEADS 180 MG PO CP24: 180.0000 mg | ORAL_CAPSULE | Freq: Every day | ORAL | 3 refills | Status: DC

## 2017-07-09 NOTE — Patient Instructions (Addendum)
Medication Instructions:  Your physician has recommended you make the following change in your medication:  1.  Start taking Eliquis 2.5 mg one tablet by mouth twice a day. 2.  STOP taking your Cartia XT 120 mg daily. 3.  Start taking diltiazem (Cardizem CD) 180 mg one tablet by mouth daily.  Labwork: None ordered.  Testing/Procedures: None ordered.  Follow-Up: Your physician wants you to follow-up in: one week with Roderic Palau NP at the afib clinic.  Any Other Special Instructions Will Be Listed Below (If Applicable).  If you need a refill on your cardiac medications before your next appointment, please call your pharmacy.

## 2017-07-09 NOTE — Progress Notes (Signed)
PCP: Lajean Manes, MD   Primary EP: Dr [name]  Gabriella Jackson is a 84 y.o. female who presents today for routine electrophysiology followup.  Since last being seen in our clinic, the patient reports doing reasonably well.  She has some memory impairment.  She has recently developed edema for which she was treated with lasix x 3 days.  Also found to have recurrent afib with RVR.  Today, she denies symptoms of palpitations, chest pain, shortness of breath, dizziness, presyncope, or syncope.  The patient is otherwise without complaint today.   Past Medical History:  Diagnosis Date  . Colon cancer ([place]) 2002   s/p colonic resection- chemotherapy and surgery  . Depressed   . DJD (degenerative joint disease)   . Fuchs' corneal dystrophy 2002  . Hernia, inguinal, right   . ITP (idiopathic thrombocytopenic purpura)    s/p splenectomy- remission  . Osteopenia   . Paroxysmal atrial fibrillation (HCC)    hx SVT  . PONV (postoperative nausea and vomiting)   . SVT (supraventricular tachycardia) ([place])   . Urticaria    Past Surgical History:  Procedure Laterality Date  . ABDOMINAL HYSTERECTOMY    . COLON SURGERY    . EYE SURGERY     corneal replacements and cataracts removed.  Marland Kitchen ROTATOR CUFF REPAIR Right   . SMALL INTESTINE SURGERY    . SPLENECTOMY  1999  . TOTAL HIP ARTHROPLASTY Right 11/01/2014   Procedure: RIGHT TOTAL HIP ARTHROPLASTY ANTERIOR APPROACH;  Surgeon: Paralee Cancel, MD;  Location: [facility];  Service: Orthopedics;  Laterality: Right;    ROS- all systems are reviewed and negatives except as per HPI above  Current Outpatient Medications  Medication Sig Dispense Refill  . CALCIUM PO Take 1 tablet by mouth daily.    Marland Kitchen CARTIA XT 120 MG 24 hr capsule TAKE ONE CAPSULE BY MOUTH ONCE DAILY 90 capsule 2  . Cholecalciferol (VITAMIN D3 PO) Take 1 capsule by mouth daily.    Marland Kitchen loratadine (CLARITIN) 10 MG tablet Take 10 mg by mouth daily as needed for allergies.     Marland Kitchen MELATONIN PO  Take 1 tablet by mouth at bedtime as needed (for sleep).    . mirtazapine (REMERON) 15 MG tablet Take 15 mg by mouth at bedtime.  0   No current facility-administered medications for this visit.     Physical Exam: Vitals:   07/09/17 1049  BP: 118/62  Pulse: (!) 129  Weight: 50.3 kg (111 lb)  Height: 5\' 3"  (1.6 m)    GEN- The patient is elderly and frail appearing, alert and oriented x 3 today.   Head- normocephalic, atraumatic Eyes-  Sclera clear, conjunctiva pink Ears- hearing intact Oropharynx- clear Lungs- Clear to ausculation bilaterally, normal work of breathing Heart- irregular rate and rhythm, no murmurs, rubs or gallops, PMI not laterally displaced GI- soft, NT, ND, + BS Extremities- no clubbing, cyanosis, + mild edema  EKG tracing ordered today is personally reviewed and shows afib,  V rate 129 bpm  Assessment and Plan:  1. Paroxysmal  Atrial fibrillation Back in afib She previously declined anticoagulation.  chads2vasc score is 3.  This patients CHA2DS2-VASc Score and unadjusted Ischemic Stroke Rate (% per year) is equal to 3.2 % stroke rate/year from a score of 3  Today, I discussed novel anticoagulants including  Xarelto and Eliquis as indicated for risk reduction in stroke and systemic emboli with nonvalvular atrial fibrillation.  Risks, benefits, and alternatives to each of these drugs  were discussed at length today. She would like to now start eliquis.   I will start eliquis 2.5mg  BID  Increase cardizem to 180mg  daily for RVR  Follow-up in AF clinic in 1 week and then every 3 months  2. Acute diastolic dysfunction Likely due to AF with RVR Hopefully will improve with better rate control May require additional lasix if still volume overloaded upon follow-up in AF clinic  Thompson Grayer MD, [facility] 07/09/2017 11:28 AM

## 2017-07-11 DIAGNOSIS — H903 Sensorineural hearing loss, bilateral: Secondary | ICD-10-CM | POA: Diagnosis not present

## 2017-07-16 ENCOUNTER — Ambulatory Visit (HOSPITAL_COMMUNITY): Payer: Medicare Other | Admitting: Nurse Practitioner

## 2017-07-16 DIAGNOSIS — F0280 Dementia in other diseases classified elsewhere without behavioral disturbance: Secondary | ICD-10-CM | POA: Diagnosis not present

## 2017-07-16 DIAGNOSIS — Z79899 Other long term (current) drug therapy: Secondary | ICD-10-CM | POA: Diagnosis not present

## 2017-07-16 DIAGNOSIS — G301 Alzheimer's disease with late onset: Secondary | ICD-10-CM | POA: Diagnosis not present

## 2017-07-16 DIAGNOSIS — H903 Sensorineural hearing loss, bilateral: Secondary | ICD-10-CM | POA: Diagnosis not present

## 2017-07-17 ENCOUNTER — Encounter (HOSPITAL_COMMUNITY): Payer: Self-pay | Admitting: Nurse Practitioner

## 2017-07-17 ENCOUNTER — Ambulatory Visit (HOSPITAL_COMMUNITY)
Admission: RE | Admit: 2017-07-17 | Discharge: 2017-07-17 | Disposition: A | Discharge status: Home / Self Care | Payer: Medicare Other | Source: Ambulatory Visit | Attending: Nurse Practitioner | Admitting: Nurse Practitioner

## 2017-07-17 VITALS — BP 114/66 | HR 127 | Ht 63.0 in | Wt 112.4 lb

## 2017-07-17 DIAGNOSIS — Z882 Allergy status to sulfonamides status: Secondary | ICD-10-CM | POA: Diagnosis not present

## 2017-07-17 DIAGNOSIS — Z886 Allergy status to analgesic agent status: Secondary | ICD-10-CM | POA: Insufficient documentation

## 2017-07-17 DIAGNOSIS — Z85038 Personal history of other malignant neoplasm of large intestine: Secondary | ICD-10-CM | POA: Insufficient documentation

## 2017-07-17 DIAGNOSIS — I480 Paroxysmal atrial fibrillation: Secondary | ICD-10-CM | POA: Diagnosis not present

## 2017-07-17 DIAGNOSIS — Z79899 Other long term (current) drug therapy: Secondary | ICD-10-CM | POA: Diagnosis not present

## 2017-07-17 DIAGNOSIS — F0390 Unspecified dementia without behavioral disturbance: Secondary | ICD-10-CM | POA: Diagnosis not present

## 2017-07-17 DIAGNOSIS — Z96641 Presence of right artificial hip joint: Secondary | ICD-10-CM | POA: Insufficient documentation

## 2017-07-17 DIAGNOSIS — Z885 Allergy status to narcotic agent status: Secondary | ICD-10-CM | POA: Diagnosis not present

## 2017-07-17 DIAGNOSIS — Z90710 Acquired absence of both cervix and uterus: Secondary | ICD-10-CM | POA: Diagnosis not present

## 2017-07-17 DIAGNOSIS — Z823 Family history of stroke: Secondary | ICD-10-CM | POA: Insufficient documentation

## 2017-07-17 DIAGNOSIS — M1990 Unspecified osteoarthritis, unspecified site: Secondary | ICD-10-CM | POA: Insufficient documentation

## 2017-07-17 DIAGNOSIS — Z8249 Family history of ischemic heart disease and other diseases of the circulatory system: Secondary | ICD-10-CM | POA: Insufficient documentation

## 2017-07-17 DIAGNOSIS — Z881 Allergy status to other antibiotic agents status: Secondary | ICD-10-CM | POA: Insufficient documentation

## 2017-07-17 DIAGNOSIS — Z9081 Acquired absence of spleen: Secondary | ICD-10-CM | POA: Insufficient documentation

## 2017-07-17 DIAGNOSIS — M8580 Other specified disorders of bone density and structure, unspecified site: Secondary | ICD-10-CM | POA: Insufficient documentation

## 2017-07-17 DIAGNOSIS — Z7901 Long term (current) use of anticoagulants: Secondary | ICD-10-CM | POA: Insufficient documentation

## 2017-07-17 MED ORDER — FUROSEMIDE 20 MG PO TABS: 20.0000 mg | ORAL_TABLET | Freq: Every day | ORAL | 2 refills | Status: DC

## 2017-07-17 NOTE — Progress Notes (Signed)
Primary Care Physician: Lajean Manes, MD Referring Physician: [name] is a 84 y.o. female with a h/o dementia, h/o paroxysmal  afib that is in the afib clinic for f/u from appointment with Dr. [name] last week. She was in afib with RVR at that time. Cardizem was increased to 180 mg qd from 120 mg a day. She had mild lower extremity edema.  It was anticipated that if swelling did not improve that she may need lasix.  She is in rapid afib today at 127 bpm. The daughter states that she does live alone and she is not sure if she is taking her meds correctly, even though someone does try to  pour meds for her..She has had increased swelling of her extremities over the last week. Pt is not aware of the irregular heart beat but the swelling is concerning to her. She states that she is taking eliquis 2.5 mg bid, pt started these drugs on last visit with Dr. [name] as well. Denies bleeding issues.  Today, she denies symptoms of palpitations, chest pain, shortness of breath, orthopnea, PND, dizziness, presyncope, syncope, or neurologic sequela. The patient is tolerating medications without difficulties and is otherwise without complaint today.   Past Medical History:  Diagnosis Date  . Colon cancer ([place]) 2002   s/p colonic resection- chemotherapy and surgery  . Depressed   . DJD (degenerative joint disease)   . Fuchs' corneal dystrophy 2002  . Hernia, inguinal, right   . ITP (idiopathic thrombocytopenic purpura)    s/p splenectomy- remission  . Osteopenia   . Paroxysmal atrial fibrillation (HCC)    hx SVT  . PONV (postoperative nausea and vomiting)   . SVT (supraventricular tachycardia) ([place])   . Urticaria    Past Surgical History:  Procedure Laterality Date  . ABDOMINAL HYSTERECTOMY    . COLON SURGERY    . EYE SURGERY     corneal replacements and cataracts removed.  Marland Kitchen ROTATOR CUFF REPAIR Right   . SMALL INTESTINE SURGERY    . SPLENECTOMY  1999  . TOTAL HIP  ARTHROPLASTY Right 11/01/2014   Procedure: RIGHT TOTAL HIP ARTHROPLASTY ANTERIOR APPROACH;  Surgeon: Paralee Cancel, MD;  Location: [facility];  Service: Orthopedics;  Laterality: Right;    Current Outpatient Medications  Medication Sig Dispense Refill  . apixaban (ELIQUIS) 2.5 MG TABS tablet Take 2.5 mg by mouth 2 (two) times daily.    Marland Kitchen CALCIUM PO Take 1 tablet by mouth daily.    . Cholecalciferol (VITAMIN D3 PO) Take 1 capsule by mouth daily.    Marland Kitchen diltiazem (CARDIZEM CD) 180 MG 24 hr capsule Take 1 capsule (180 mg total) by mouth daily. 90 capsule 3  . loratadine (CLARITIN) 10 MG tablet Take 10 mg by mouth daily as needed for allergies.     Marland Kitchen MELATONIN PO Take 1 tablet by mouth at bedtime as needed (for sleep).    . mirtazapine (REMERON) 15 MG tablet Take 15 mg by mouth at bedtime.  0  . furosemide (LASIX) 20 MG tablet Take 1 tablet (20 mg total) by mouth daily. 30 tablet 2   No current facility-administered medications for this encounter.     Allergies  Allergen Reactions  . Codeine Nausea Only  . Aspirin     ITP  . Campho-Phenique [Phenol]     unknown  . Ciprofloxacin Hives  . Nsaids     Lower BP  . Sulfa Antibiotics     Cannot remember  Social History   Socioeconomic History  . Marital status: Widowed    Spouse name: Not on file  . Number of children: Not on file  . Years of education: Not on file  . Highest education level: Not on file  Occupational History  . Not on file  Social Needs  . Financial resource strain: Not on file  . Food insecurity:    Worry: Not on file    Inability: Not on file  . Transportation needs:    Medical: Not on file    Non-medical: Not on file  Tobacco Use  . Smoking status: Never Smoker  . Smokeless tobacco: Never Used  Substance and Sexual Activity  . Alcohol use: Yes    Alcohol/week: 3.5 oz    Types: 7 Standard drinks or equivalent per week    Comment: small glass of wine each day  . Drug use: No  . Sexual activity: Never     Birth control/protection: None  Lifestyle  . Physical activity:    Days per week: Not on file    Minutes per session: Not on file  . Stress: Not on file  Relationships  . Social connections:    Talks on phone: Not on file    Gets together: Not on file    Attends religious service: Not on file    Active member of club or organization: Not on file    Attends meetings of clubs or organizations: Not on file    Relationship status: Not on file  . Intimate partner violence:    Fear of current or ex partner: Not on file    Emotionally abused: Not on file    Physically abused: Not on file    Forced sexual activity: Not on file  Other Topics Concern  . Not on file  Social History Narrative   Lives in [place] alone.   Retired Network engineer       Family History  Problem Relation Age of Onset  . Cancer Mother   . Stroke Father   . Heart disease Father     ROS- All systems are reviewed and negative except as per the HPI above  Physical Exam: Vitals:   07/17/17 1512  BP: 114/66  Pulse: (!) 127  SpO2: 95%  Weight: 112 lb 6.4 oz (51 kg)  Height: 5\' 3"  (1.6 m)   Wt Readings from Last 3 Encounters:  07/17/17 112 lb 6.4 oz (51 kg)  07/09/17 111 lb (50.3 kg)  02/05/17 110 lb 6.4 oz (50.1 kg)    Labs: Lab Results  Component Value Date   NA 141 08/29/2016   K 4.0 08/29/2016   CL 110 08/29/2016   CO2 24 08/29/2016   GLUCOSE 95 08/29/2016   BUN 22 (H) 08/29/2016   CREATININE 0.77 08/29/2016   CALCIUM 8.6 (L) 08/29/2016   Lab Results  Component Value Date   INR 0.95 10/26/2014   No results found for: CHOL, HDL, LDLCALC, TRIG   GEN- The patient is elderly appearing, alert and oriented x 3 today.   Head- normocephalic, atraumatic Eyes-  Sclera clear, conjunctiva pink Ears- hearing intact Oropharynx- clear Neck- supple, no JVP Lymph- no cervical lymphadenopathy Lungs- Clear to ausculation bilaterally, normal work of breathing Heart- irregular rate and rhythm, no  murmurs, rubs or gallops, PMI not laterally displaced GI- soft, NT, ND, + BS Extremities- no clubbing, cyanosis, or edema MS- no significant deformity or atrophy Skin- no rash or lesion Psych- euthymic mood, full affect Neuro-  strength and sensation are intact  EKG-afib at 127 bpm, qrs int 76 ms, qtc 476 ms    Assessment and Plan: 1. Paroxsymal  afib Continue  180 mg Cardizem daily May be contributing to more LEE vrs rapid afib or both Will start 20 mg lasix daily If swelling continues as well as RVR, will consider reducing Cardizem to 120 mg day and adding low dose BB  2. Chadsvasc  score of 3 Continue Eliquis at 2.5 mg bid Denies bleeding issues   Geroge Baseman. Money Mckeithan, [facility] [address] [place], [place] [address] [phone]

## 2017-07-17 NOTE — Patient Instructions (Signed)
Lasix 20mg once a day

## 2017-07-18 NOTE — Addendum Note (Signed)
Addended by: Marlis Edelson C on: 07/18/2017 11:03 AM   Modules accepted: Orders

## 2017-07-21 ENCOUNTER — Encounter (HOSPITAL_COMMUNITY): Payer: Self-pay | Admitting: Nurse Practitioner

## 2017-07-21 ENCOUNTER — Ambulatory Visit (HOSPITAL_COMMUNITY)
Admission: RE | Admit: 2017-07-21 | Discharge: 2017-07-21 | Disposition: A | Discharge status: Home / Self Care | Payer: Medicare Other | Source: Ambulatory Visit | Attending: Nurse Practitioner | Admitting: Nurse Practitioner

## 2017-07-21 VITALS — BP 116/64 | HR 109 | Ht 63.0 in | Wt 106.0 lb

## 2017-07-21 DIAGNOSIS — H6122 Impacted cerumen, left ear: Secondary | ICD-10-CM | POA: Insufficient documentation

## 2017-07-21 DIAGNOSIS — Z809 Family history of malignant neoplasm, unspecified: Secondary | ICD-10-CM | POA: Insufficient documentation

## 2017-07-21 DIAGNOSIS — Z885 Allergy status to narcotic agent status: Secondary | ICD-10-CM | POA: Insufficient documentation

## 2017-07-21 DIAGNOSIS — Z9889 Other specified postprocedural states: Secondary | ICD-10-CM | POA: Insufficient documentation

## 2017-07-21 DIAGNOSIS — Z96641 Presence of right artificial hip joint: Secondary | ICD-10-CM | POA: Insufficient documentation

## 2017-07-21 DIAGNOSIS — Z9081 Acquired absence of spleen: Secondary | ICD-10-CM | POA: Diagnosis not present

## 2017-07-21 DIAGNOSIS — I4819 Other persistent atrial fibrillation: Secondary | ICD-10-CM

## 2017-07-21 DIAGNOSIS — Z888 Allergy status to other drugs, medicaments and biological substances status: Secondary | ICD-10-CM | POA: Insufficient documentation

## 2017-07-21 DIAGNOSIS — Z886 Allergy status to analgesic agent status: Secondary | ICD-10-CM | POA: Diagnosis not present

## 2017-07-21 DIAGNOSIS — Z90710 Acquired absence of both cervix and uterus: Secondary | ICD-10-CM | POA: Diagnosis not present

## 2017-07-21 DIAGNOSIS — Z79899 Other long term (current) drug therapy: Secondary | ICD-10-CM | POA: Insufficient documentation

## 2017-07-21 DIAGNOSIS — M8580 Other specified disorders of bone density and structure, unspecified site: Secondary | ICD-10-CM | POA: Insufficient documentation

## 2017-07-21 DIAGNOSIS — Z8249 Family history of ischemic heart disease and other diseases of the circulatory system: Secondary | ICD-10-CM | POA: Diagnosis not present

## 2017-07-21 DIAGNOSIS — Z7902 Long term (current) use of antithrombotics/antiplatelets: Secondary | ICD-10-CM | POA: Diagnosis not present

## 2017-07-21 DIAGNOSIS — H903 Sensorineural hearing loss, bilateral: Secondary | ICD-10-CM | POA: Diagnosis not present

## 2017-07-21 DIAGNOSIS — Z882 Allergy status to sulfonamides status: Secondary | ICD-10-CM | POA: Insufficient documentation

## 2017-07-21 DIAGNOSIS — Z823 Family history of stroke: Secondary | ICD-10-CM | POA: Insufficient documentation

## 2017-07-21 DIAGNOSIS — I471 Supraventricular tachycardia: Secondary | ICD-10-CM | POA: Diagnosis not present

## 2017-07-21 DIAGNOSIS — Z881 Allergy status to other antibiotic agents status: Secondary | ICD-10-CM | POA: Insufficient documentation

## 2017-07-21 DIAGNOSIS — Z974 Presence of external hearing-aid: Secondary | ICD-10-CM | POA: Diagnosis not present

## 2017-07-21 DIAGNOSIS — I481 Persistent atrial fibrillation: Secondary | ICD-10-CM

## 2017-07-21 DIAGNOSIS — H938X1 Other specified disorders of right ear: Secondary | ICD-10-CM | POA: Diagnosis not present

## 2017-07-21 DIAGNOSIS — Z85038 Personal history of other malignant neoplasm of large intestine: Secondary | ICD-10-CM | POA: Diagnosis not present

## 2017-07-21 DIAGNOSIS — I480 Paroxysmal atrial fibrillation: Secondary | ICD-10-CM | POA: Insufficient documentation

## 2017-07-21 LAB — BASIC METABOLIC PANEL
Anion gap: 11 (ref 5–15)
BUN: 18 mg/dL (ref 6–20)
CO2: 26 mmol/L (ref 22–32)
Calcium: 8.9 mg/dL (ref 8.9–10.3)
Chloride: 102 mmol/L (ref 101–111)
Creatinine, Ser: 1.05 mg/dL — ABNORMAL HIGH (ref 0.44–1.00)
GFR, EST AFRICAN AMERICAN: 53 mL/min — AB (ref >60)
GFR, EST NON AFRICAN AMERICAN: 45 mL/min — AB (ref >60)
Glucose, Bld: 95 mg/dL (ref 65–99)
Potassium: 4.1 mmol/L (ref 3.5–5.1)
SODIUM: 139 mmol/L (ref 135–145)

## 2017-07-21 MED ORDER — FUROSEMIDE 20 MG PO TABS: 10.0000 mg | ORAL_TABLET | Freq: Every day | ORAL | 2 refills | Status: DC

## 2017-07-21 NOTE — Progress Notes (Signed)
Primary Care Physician: Lajean Manes, MD Referring Physician: [name] is a 84 y.o. female with a h/o dementia, h/o paroxysmal  afib that is in the afib clinic for f/u from appointment with Dr. [name] last week. She was in afib with RVR at that time. Cardizem was increased to 180 mg qd from 120 mg a day. She had mild lower extremity edema.  It was anticipated that if swelling did not improve that she may need lasix.  She is in rapid afib when seen in afib clinic, 07/17/17, at 127 bpm. The daughter states that she does live alone and she is not sure if she is taking her meds correctly, even though someone does try to  pour meds for her.She has had increased swelling of her extremities over the last week. Pt is not aware of the irregular heart beat but the swelling is concerning to her. She states that she is taking eliquis 2.5 mg bid, pt started these drugs on last visit with Dr. [name] as well. Denies bleeding issues.  F/u in afib clinic, 4/15. She was started on lasix 20 mg daily on last visit and her weight is down 6 lbs. LLE decreased. Her HR is now 109 bpm in afib. She would like to decrease lasix or stop it as she is in the bathroom a lot.  Today, she denies symptoms of palpitations, chest pain, shortness of breath, orthopnea, PND, dizziness, presyncope, syncope, or neurologic sequela. The patient is tolerating medications without difficulties and is otherwise without complaint today.   Past Medical History:  Diagnosis Date  . Colon cancer ([place]) 2002   s/p colonic resection- chemotherapy and surgery  . Depressed   . DJD (degenerative joint disease)   . Fuchs' corneal dystrophy 2002  . Hernia, inguinal, right   . ITP (idiopathic thrombocytopenic purpura)    s/p splenectomy- remission  . Osteopenia   . Paroxysmal atrial fibrillation (HCC)    hx SVT  . PONV (postoperative nausea and vomiting)   . SVT (supraventricular tachycardia) ([place])   . Urticaria    Past  Surgical History:  Procedure Laterality Date  . ABDOMINAL HYSTERECTOMY    . COLON SURGERY    . EYE SURGERY     corneal replacements and cataracts removed.  Marland Kitchen ROTATOR CUFF REPAIR Right   . SMALL INTESTINE SURGERY    . SPLENECTOMY  1999  . TOTAL HIP ARTHROPLASTY Right 11/01/2014   Procedure: RIGHT TOTAL HIP ARTHROPLASTY ANTERIOR APPROACH;  Surgeon: Paralee Cancel, MD;  Location: [facility];  Service: Orthopedics;  Laterality: Right;    Current Outpatient Medications  Medication Sig Dispense Refill  . apixaban (ELIQUIS) 2.5 MG TABS tablet Take 2.5 mg by mouth 2 (two) times daily.    Marland Kitchen CALCIUM PO Take 1 tablet by mouth daily.    . Cholecalciferol (VITAMIN D3 PO) Take 1 capsule by mouth daily.    Marland Kitchen diltiazem (CARDIZEM CD) 180 MG 24 hr capsule Take 1 capsule (180 mg total) by mouth daily. 90 capsule 3  . furosemide (LASIX) 20 MG tablet Take 1 tablet (20 mg total) by mouth daily. 30 tablet 2  . loratadine (CLARITIN) 10 MG tablet Take 10 mg by mouth daily as needed for allergies.     Marland Kitchen MELATONIN PO Take 1 tablet by mouth at bedtime as needed (for sleep).    . mirtazapine (REMERON) 15 MG tablet Take 15 mg by mouth at bedtime.  0   No current facility-administered medications  for this encounter.     Allergies  Allergen Reactions  . Codeine Nausea Only  . Aspirin     ITP  . Campho-Phenique [Phenol]     unknown  . Ciprofloxacin Hives  . Nsaids     Lower BP  . Sulfa Antibiotics     Cannot remember    Social History   Socioeconomic History  . Marital status: Widowed    Spouse name: Not on file  . Number of children: Not on file  . Years of education: Not on file  . Highest education level: Not on file  Occupational History  . Not on file  Social Needs  . Financial resource strain: Not on file  . Food insecurity:    Worry: Not on file    Inability: Not on file  . Transportation needs:    Medical: Not on file    Non-medical: Not on file  Tobacco Use  . Smoking status: Never  Smoker  . Smokeless tobacco: Never Used  Substance and Sexual Activity  . Alcohol use: Yes    Alcohol/week: 3.5 oz    Types: 7 Standard drinks or equivalent per week    Comment: small glass of wine each day  . Drug use: No  . Sexual activity: Never    Birth control/protection: None  Lifestyle  . Physical activity:    Days per week: Not on file    Minutes per session: Not on file  . Stress: Not on file  Relationships  . Social connections:    Talks on phone: Not on file    Gets together: Not on file    Attends religious service: Not on file    Active member of club or organization: Not on file    Attends meetings of clubs or organizations: Not on file    Relationship status: Not on file  . Intimate partner violence:    Fear of current or ex partner: Not on file    Emotionally abused: Not on file    Physically abused: Not on file    Forced sexual activity: Not on file  Other Topics Concern  . Not on file  Social History Narrative   Lives in [place] alone.   Retired Network engineer       Family History  Problem Relation Age of Onset  . Cancer Mother   . Stroke Father   . Heart disease Father     ROS- All systems are reviewed and negative except as per the HPI above  Physical Exam: Vitals:   07/21/17 1552  BP: 116/64  Pulse: (!) 109  Weight: 106 lb (48.1 kg)  Height: 5\' 3"  (1.6 m)   Wt Readings from Last 3 Encounters:  07/21/17 106 lb (48.1 kg)  07/17/17 112 lb 6.4 oz (51 kg)  07/09/17 111 lb (50.3 kg)    Labs: Lab Results  Component Value Date   NA 141 08/29/2016   K 4.0 08/29/2016   CL 110 08/29/2016   CO2 24 08/29/2016   GLUCOSE 95 08/29/2016   BUN 22 (H) 08/29/2016   CREATININE 0.77 08/29/2016   CALCIUM 8.6 (L) 08/29/2016   Lab Results  Component Value Date   INR 0.95 10/26/2014   No results found for: CHOL, HDL, LDLCALC, TRIG   GEN- The patient is elderly appearing, alert and oriented x 3 today.   Head- normocephalic, atraumatic Eyes-   Sclera clear, conjunctiva pink Ears- hearing intact Oropharynx- clear Neck- supple, no JVP Lymph- no cervical lymphadenopathy  Lungs- Clear to ausculation bilaterally, normal work of breathing Heart- irregular rate and rhythm, no murmurs, rubs or gallops, PMI not laterally displaced GI- soft, NT, ND, + BS Extremities- no clubbing, cyanosis, or edema MS- no significant deformity or atrophy Skin- no rash or lesion Psych- euthymic mood, full affect Neuro- strength and sensation are intact  EKG-afib at 109 bpm, qrs int 78 ms, qt 417 ms    Assessment and Plan: 1. Paroxsymal  afib Continue  180 mg Cardizem daily Decrease 20 mg lasix to 1/2 tab daily Bmet today If swelling continues as well as RVR, will consider reducing Cardizem to 120 mg day and adding low dose BB  2. Chadsvasc  score of 3 Continue Eliquis at 2.5 mg bid Denies bleeding issues   Geroge Baseman. Islam Eichinger, [facility] [address] [place], [place] [address] [phone]

## 2017-07-21 NOTE — Patient Instructions (Signed)
Your physician has recommended you make the following change in your medication:  1)Decrease lasix to 1/2 tablet once a day (10mg  a day)

## 2017-07-22 ENCOUNTER — Ambulatory Visit (HOSPITAL_COMMUNITY): Payer: Medicare Other | Admitting: Nurse Practitioner

## 2017-07-30 ENCOUNTER — Encounter (HOSPITAL_COMMUNITY): Payer: Self-pay | Admitting: Nurse Practitioner

## 2017-07-30 ENCOUNTER — Ambulatory Visit (HOSPITAL_COMMUNITY)
Admission: RE | Admit: 2017-07-30 | Discharge: 2017-07-30 | Disposition: A | Discharge status: Home / Self Care | Payer: Medicare Other | Source: Ambulatory Visit | Attending: Nurse Practitioner | Admitting: Nurse Practitioner

## 2017-07-30 VITALS — BP 100/62 | HR 92 | Ht 63.0 in | Wt 104.0 lb

## 2017-07-30 DIAGNOSIS — I481 Persistent atrial fibrillation: Secondary | ICD-10-CM

## 2017-07-30 DIAGNOSIS — I480 Paroxysmal atrial fibrillation: Secondary | ICD-10-CM | POA: Insufficient documentation

## 2017-07-30 DIAGNOSIS — M1990 Unspecified osteoarthritis, unspecified site: Secondary | ICD-10-CM | POA: Insufficient documentation

## 2017-07-30 DIAGNOSIS — Z85038 Personal history of other malignant neoplasm of large intestine: Secondary | ICD-10-CM | POA: Diagnosis not present

## 2017-07-30 DIAGNOSIS — I4819 Other persistent atrial fibrillation: Secondary | ICD-10-CM

## 2017-07-30 DIAGNOSIS — Z823 Family history of stroke: Secondary | ICD-10-CM | POA: Insufficient documentation

## 2017-07-30 DIAGNOSIS — I471 Supraventricular tachycardia: Secondary | ICD-10-CM | POA: Diagnosis not present

## 2017-07-30 DIAGNOSIS — M8580 Other specified disorders of bone density and structure, unspecified site: Secondary | ICD-10-CM | POA: Diagnosis not present

## 2017-07-30 DIAGNOSIS — Z9889 Other specified postprocedural states: Secondary | ICD-10-CM | POA: Insufficient documentation

## 2017-07-30 DIAGNOSIS — D693 Immune thrombocytopenic purpura: Secondary | ICD-10-CM | POA: Insufficient documentation

## 2017-07-30 DIAGNOSIS — Z881 Allergy status to other antibiotic agents status: Secondary | ICD-10-CM | POA: Insufficient documentation

## 2017-07-30 DIAGNOSIS — Z79899 Other long term (current) drug therapy: Secondary | ICD-10-CM | POA: Insufficient documentation

## 2017-07-30 DIAGNOSIS — H1851 Endothelial corneal dystrophy: Secondary | ICD-10-CM | POA: Insufficient documentation

## 2017-07-30 DIAGNOSIS — Z9081 Acquired absence of spleen: Secondary | ICD-10-CM | POA: Diagnosis not present

## 2017-07-30 DIAGNOSIS — Z809 Family history of malignant neoplasm, unspecified: Secondary | ICD-10-CM | POA: Insufficient documentation

## 2017-07-30 DIAGNOSIS — Z882 Allergy status to sulfonamides status: Secondary | ICD-10-CM | POA: Diagnosis not present

## 2017-07-30 DIAGNOSIS — Z886 Allergy status to analgesic agent status: Secondary | ICD-10-CM | POA: Diagnosis not present

## 2017-07-30 DIAGNOSIS — Z96641 Presence of right artificial hip joint: Secondary | ICD-10-CM | POA: Diagnosis not present

## 2017-07-30 DIAGNOSIS — Z7901 Long term (current) use of anticoagulants: Secondary | ICD-10-CM | POA: Diagnosis not present

## 2017-07-30 DIAGNOSIS — Z8249 Family history of ischemic heart disease and other diseases of the circulatory system: Secondary | ICD-10-CM | POA: Insufficient documentation

## 2017-07-30 DIAGNOSIS — Z90710 Acquired absence of both cervix and uterus: Secondary | ICD-10-CM | POA: Diagnosis not present

## 2017-07-30 DIAGNOSIS — Z885 Allergy status to narcotic agent status: Secondary | ICD-10-CM | POA: Diagnosis not present

## 2017-07-30 NOTE — Progress Notes (Signed)
Primary Care Physician: Lajean Manes, MD Referring Physician: [name] is a 84 y.o. female with a h/o dementia, h/o paroxysmal  afib that is in the afib clinic for f/u from appointment with Dr. [name] last week. She was in afib with RVR at that time. Cardizem was increased to 180 mg qd from 120 mg a day. She had mild lower extremity edema.  It was anticipated that if swelling did not improve that she may need lasix.  She is in rapid afib today at 127 bpm. The daughter states that she does live alone and she is not sure if she is taking her meds correctly, even though someone does try to  pour meds for her..She has had increased swelling of her extremities over the last week. Pt is not aware of the irregular heart beat but the swelling is concerning to her. She states that she is taking eliquis 2.5 mg bid, pt started these drugs on last visit with Dr. [name] as well. Denies bleeding issues.  F/u in afib clinc, 4/24. Pt is rate controlled today, is not symptomatic with afib and has lost 2 lbs last week, despite lasix being reduced to 10 mg a day. Her lower extremities are the smallest I have seen them.   Today, she denies symptoms of palpitations, chest pain, shortness of breath, orthopnea, PND, dizziness, presyncope, syncope, or neurologic sequela. The patient is tolerating medications without difficulties and is otherwise without complaint today.   Past Medical History:  Diagnosis Date  . Colon cancer ([place]) 2002   s/p colonic resection- chemotherapy and surgery  . Depressed   . DJD (degenerative joint disease)   . Fuchs' corneal dystrophy 2002  . Hernia, inguinal, right   . ITP (idiopathic thrombocytopenic purpura)    s/p splenectomy- remission  . Osteopenia   . Paroxysmal atrial fibrillation ([facility])    hx SVT  . PONV (postoperative nausea and vomiting)   . SVT (supraventricular tachycardia) ([place])   . Urticaria    Past Surgical History:  Procedure Laterality  Date  . ABDOMINAL HYSTERECTOMY    . COLON SURGERY    . EYE SURGERY     corneal replacements and cataracts removed.  Marland Kitchen ROTATOR CUFF REPAIR Right   . SMALL INTESTINE SURGERY    . SPLENECTOMY  1999  . TOTAL HIP ARTHROPLASTY Right 11/01/2014   Procedure: RIGHT TOTAL HIP ARTHROPLASTY ANTERIOR APPROACH;  Surgeon: Paralee Cancel, MD;  Location: [facility];  Service: Orthopedics;  Laterality: Right;    Current Outpatient Medications  Medication Sig Dispense Refill  . apixaban (ELIQUIS) 2.5 MG TABS tablet Take 2.5 mg by mouth 2 (two) times daily.    Marland Kitchen CALCIUM PO Take 1 tablet by mouth daily.    . Cholecalciferol (VITAMIN D3 PO) Take 1 capsule by mouth daily.    Marland Kitchen diltiazem (CARDIZEM CD) 180 MG 24 hr capsule Take 1 capsule (180 mg total) by mouth daily. 90 capsule 3  . furosemide (LASIX) 20 MG tablet Take 0.5 tablets (10 mg total) by mouth daily. 30 tablet 2  . MELATONIN PO Take 1 tablet by mouth at bedtime as needed (for sleep).    . mirtazapine (REMERON) 15 MG tablet Take 15 mg by mouth at bedtime.  0  . loratadine (CLARITIN) 10 MG tablet Take 10 mg by mouth daily as needed for allergies.      No current facility-administered medications for this encounter.     Allergies  Allergen Reactions  .  Codeine Nausea Only  . Aspirin     ITP  . Campho-Phenique [Phenol]     unknown  . Ciprofloxacin Hives  . Nsaids     Lower BP  . Sulfa Antibiotics     Cannot remember    Social History   Socioeconomic History  . Marital status: Widowed    Spouse name: Not on file  . Number of children: Not on file  . Years of education: Not on file  . Highest education level: Not on file  Occupational History  . Not on file  Social Needs  . Financial resource strain: Not on file  . Food insecurity:    Worry: Not on file    Inability: Not on file  . Transportation needs:    Medical: Not on file    Non-medical: Not on file  Tobacco Use  . Smoking status: Never Smoker  . Smokeless tobacco: Never Used    Substance and Sexual Activity  . Alcohol use: Yes    Alcohol/week: 3.5 oz    Types: 7 Standard drinks or equivalent per week    Comment: small glass of wine each day  . Drug use: No  . Sexual activity: Never    Birth control/protection: None  Lifestyle  . Physical activity:    Days per week: Not on file    Minutes per session: Not on file  . Stress: Not on file  Relationships  . Social connections:    Talks on phone: Not on file    Gets together: Not on file    Attends religious service: Not on file    Active member of club or organization: Not on file    Attends meetings of clubs or organizations: Not on file    Relationship status: Not on file  . Intimate partner violence:    Fear of current or ex partner: Not on file    Emotionally abused: Not on file    Physically abused: Not on file    Forced sexual activity: Not on file  Other Topics Concern  . 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   Retired Network engineer       Family History  Problem Relation Age of Onset  . Cancer Mother   . Stroke Father   . Heart disease Father     ROS- All systems are reviewed and negative except as per the HPI above  Physical Exam: Vitals:   07/30/17 1427  BP: 100/62  Pulse: 92  Weight: 104 lb (47.2 kg)  Height: 5\' 3"  (1.6 m)   Wt Readings from Last 3 Encounters:  07/30/17 104 lb (47.2 kg)  07/21/17 106 lb (48.1 kg)  07/17/17 112 lb 6.4 oz (51 kg)    Labs: Lab Results  Component Value Date   NA 139 07/21/2017   K 4.1 07/21/2017   CL 102 07/21/2017   CO2 26 07/21/2017   GLUCOSE 95 07/21/2017   BUN 18 07/21/2017   CREATININE 1.05 (H) 07/21/2017   CALCIUM 8.9 07/21/2017   Lab Results  Component Value Date   INR 0.95 10/26/2014   No results found for: CHOL, HDL, LDLCALC, TRIG   GEN- The patient is elderly appearing, alert and oriented x 3 today, HOH.   Head- normocephalic, atraumatic Eyes-  Sclera clear, conjunctiva pink Ears- hearing  intact Oropharynx- clear Neck- supple, no JVP Lymph- no cervical lymphadenopathy Lungs- Clear to ausculation bilaterally, normal work of breathing Heart- irregular rate and  rhythm, no murmurs, rubs or gallops, PMI not laterally displaced GI- soft, NT, ND, + BS Extremities- no clubbing, cyanosis, or  Trace edema, support hose in place MS- no significant deformity or atrophy Skin- no rash or lesion Psych- euthymic mood, full affect Neuro- strength and sensation are intact  EKG-afib at 92 bpm, qrs int 76 ms, qtc 430 ms    Assessment and Plan: 1. Paroxsymal  afib Continue  180 mg Cardizem daily Rate controlled today Continue 10 mg lasix daily   2. Chadsvasc  score of 3 Continue Eliquis at 2.5 mg bid Denies bleeding issues    F/u in 6 weeks   Butch Penny C. Rainbow Salman, [facility] [address] [place], [place] [address] [phone]

## 2017-08-01 ENCOUNTER — Other Ambulatory Visit (HOSPITAL_COMMUNITY): Payer: Self-pay | Admitting: *Deleted

## 2017-08-01 MED ORDER — APIXABAN 2.5 MG PO TABS: 2.5000 mg | ORAL_TABLET | Freq: Two times a day (BID) | ORAL | 1 refills | Status: DC

## 2017-08-12 NOTE — Addendum Note (Signed)
Encounter addended by: Sherran Needs, NP on: 08/12/2017 9:30 AM  Actions taken: LOS modified

## 2017-08-27 DIAGNOSIS — F0280 Dementia in other diseases classified elsewhere without behavioral disturbance: Secondary | ICD-10-CM | POA: Diagnosis not present

## 2017-08-27 DIAGNOSIS — G301 Alzheimer's disease with late onset: Secondary | ICD-10-CM | POA: Diagnosis not present

## 2017-08-27 DIAGNOSIS — I480 Paroxysmal atrial fibrillation: Secondary | ICD-10-CM | POA: Diagnosis not present

## 2017-09-09 ENCOUNTER — Ambulatory Visit (HOSPITAL_COMMUNITY): Payer: Medicare Other | Admitting: Nurse Practitioner

## 2017-09-10 ENCOUNTER — Ambulatory Visit (HOSPITAL_COMMUNITY): Payer: Medicare Other | Admitting: Nurse Practitioner

## 2017-09-24 ENCOUNTER — Ambulatory Visit (HOSPITAL_COMMUNITY)
Admission: RE | Admit: 2017-09-24 | Discharge: 2017-09-24 | Disposition: A | Discharge status: Home / Self Care | Payer: Medicare Other | Source: Ambulatory Visit | Attending: Nurse Practitioner | Admitting: Nurse Practitioner

## 2017-09-24 ENCOUNTER — Encounter (HOSPITAL_COMMUNITY): Payer: Self-pay | Admitting: Nurse Practitioner

## 2017-09-24 VITALS — BP 102/64 | HR 129 | Ht 63.0 in | Wt 106.0 lb

## 2017-09-24 DIAGNOSIS — Z886 Allergy status to analgesic agent status: Secondary | ICD-10-CM | POA: Diagnosis not present

## 2017-09-24 DIAGNOSIS — I471 Supraventricular tachycardia: Secondary | ICD-10-CM | POA: Insufficient documentation

## 2017-09-24 DIAGNOSIS — Z7901 Long term (current) use of anticoagulants: Secondary | ICD-10-CM | POA: Insufficient documentation

## 2017-09-24 DIAGNOSIS — I4819 Other persistent atrial fibrillation: Secondary | ICD-10-CM

## 2017-09-24 DIAGNOSIS — Z79899 Other long term (current) drug therapy: Secondary | ICD-10-CM | POA: Diagnosis not present

## 2017-09-24 DIAGNOSIS — I5032 Chronic diastolic (congestive) heart failure: Secondary | ICD-10-CM | POA: Insufficient documentation

## 2017-09-24 DIAGNOSIS — I480 Paroxysmal atrial fibrillation: Secondary | ICD-10-CM | POA: Insufficient documentation

## 2017-09-24 DIAGNOSIS — Z885 Allergy status to narcotic agent status: Secondary | ICD-10-CM | POA: Diagnosis not present

## 2017-09-24 DIAGNOSIS — Z881 Allergy status to other antibiotic agents status: Secondary | ICD-10-CM | POA: Diagnosis not present

## 2017-09-24 DIAGNOSIS — I493 Ventricular premature depolarization: Secondary | ICD-10-CM | POA: Diagnosis not present

## 2017-09-24 DIAGNOSIS — I481 Persistent atrial fibrillation: Secondary | ICD-10-CM | POA: Diagnosis not present

## 2017-09-24 DIAGNOSIS — Z882 Allergy status to sulfonamides status: Secondary | ICD-10-CM | POA: Insufficient documentation

## 2017-09-24 DIAGNOSIS — Z96641 Presence of right artificial hip joint: Secondary | ICD-10-CM | POA: Diagnosis not present

## 2017-09-24 MED ORDER — DILTIAZEM HCL ER COATED BEADS 120 MG PO CP24: 120.0000 mg | ORAL_CAPSULE | Freq: Every day | ORAL | 3 refills | Status: DC

## 2017-09-24 MED ORDER — METOPROLOL TARTRATE 25 MG PO TABS: 12.5000 mg | ORAL_TABLET | Freq: Two times a day (BID) | ORAL | 3 refills | Status: DC

## 2017-09-24 NOTE — Patient Instructions (Signed)
Decrease cardizem 120mg  once a day  Start metoprolol 1/2 tablet twice a day with food

## 2017-09-24 NOTE — Progress Notes (Signed)
Primary Care Physician: Lajean Manes, MD Primary Electrophysiologist: [name]  Gabriella Jackson is a 84 y.o. female with a history of persistent atrial fibrillation who presents for follow up in the [facility].  Since last being seen in clinic, the patient reports doing relatively well.  She has had increased fatigue since last being seen.  LE edema is improved.  She does exercises at her assisted living facility.  She took her meds this morning. No bleeding issues.   Today, she denies symptoms of palpitations, chest pain, shortness of breath, orthopnea, PND, lower extremity edema, dizziness, presyncope, syncope, snoring, daytime somnolence, bleeding, or neurologic sequela. The patient is tolerating medications without difficulties and is otherwise without complaint today.    Atrial Fibrillation Risk Factors:  she does not have symptoms or diagnosis of sleep apnea.  she does not have a history of rheumatic fever.  she does not have a history of alcohol use.  she has a BMI of Body mass index is 18.78 kg/m.Marland Kitchen Filed Weights   09/24/17 1333  Weight: 106 lb (48.1 kg)    LA size: 40   Atrial Fibrillation Management history:  Previous antiarrhythmic drugs: none  Previous cardioversions: none  Previous ablations: none  CHADS2VASC score: 3  Anticoagulation history: Eliquis   Past Medical History:  Diagnosis Date  . Colon cancer ([place]) 2002   s/p colonic resection- chemotherapy and surgery  . Depressed   . DJD (degenerative joint disease)   . Fuchs' corneal dystrophy 2002  . Hernia, inguinal, right   . ITP (idiopathic thrombocytopenic purpura)    s/p splenectomy- remission  . Osteopenia   . Paroxysmal atrial fibrillation (HCC)    hx SVT  . PONV (postoperative nausea and vomiting)   . SVT (supraventricular tachycardia) ([place])   . Urticaria    Past Surgical History:  Procedure Laterality Date  . ABDOMINAL HYSTERECTOMY    . COLON SURGERY     . EYE SURGERY     corneal replacements and cataracts removed.  Marland Kitchen ROTATOR CUFF REPAIR Right   . SMALL INTESTINE SURGERY    . SPLENECTOMY  1999  . TOTAL HIP ARTHROPLASTY Right 11/01/2014   Procedure: RIGHT TOTAL HIP ARTHROPLASTY ANTERIOR APPROACH;  Surgeon: Paralee Cancel, MD;  Location: [facility];  Service: Orthopedics;  Laterality: Right;    Current Outpatient Medications  Medication Sig Dispense Refill  . apixaban (ELIQUIS) 2.5 MG TABS tablet Take 1 tablet (2.5 mg total) by mouth 2 (two) times daily. 180 tablet 1  . CALCIUM PO Take 1 tablet by mouth daily.    . Cholecalciferol (VITAMIN D3 PO) Take 1 capsule by mouth daily.    Marland Kitchen diltiazem (CARDIZEM CD) 120 MG 24 hr capsule Take 1 capsule (120 mg total) by mouth daily. 30 capsule 3  . donepezil (ARICEPT) 5 MG tablet Take 5 mg by mouth at bedtime.    . furosemide (LASIX) 20 MG tablet Take 0.5 tablets (10 mg total) by mouth daily. 30 tablet 2  . mirtazapine (REMERON) 15 MG tablet Take 15 mg by mouth at bedtime.  0  . metoprolol tartrate (LOPRESSOR) 25 MG tablet Take 0.5 tablets (12.5 mg total) by mouth 2 (two) times daily. 30 tablet 3   No current facility-administered medications for this encounter.     Allergies  Allergen Reactions  . Codeine Nausea Only  . Aspirin     ITP  . Campho-Phenique [Phenol]     unknown  . Ciprofloxacin Hives  . Nsaids  Lower BP  . Sulfa Antibiotics     Cannot remember    Social History   Socioeconomic History  . Marital status: Widowed    Spouse name: Not on file  . Number of children: Not on file  . Years of education: Not on file  . Highest education level: Not on file  Occupational History  . Not on file  Social Needs  . Financial resource strain: Not on file  . Food insecurity:    Worry: Not on file    Inability: Not on file  . Transportation needs:    Medical: Not on file    Non-medical: Not on file  Tobacco Use  . Smoking status: Never Smoker  . Smokeless tobacco: Never Used    Substance and Sexual Activity  . Alcohol use: Yes    Alcohol/week: 4.2 oz    Types: 7 Standard drinks or equivalent per week    Comment: small glass of wine each day  . Drug use: No  . Sexual activity: Never    Birth control/protection: None  Lifestyle  . Physical activity:    Days per week: Not on file    Minutes per session: Not on file  . Stress: Not on file  Relationships  . Social connections:    Talks on phone: Not on file    Gets together: Not on file    Attends religious service: Not on file    Active member of club or organization: Not on file    Attends meetings of clubs or organizations: Not on file    Relationship status: Not on file  . Intimate partner violence:    Fear of current or ex partner: Not on file    Emotionally abused: Not on file    Physically abused: Not on file    Forced sexual activity: Not on file  Other Topics Concern  . Not on file  Social History Narrative   Lives in [place] alone.   Retired Network engineer       Family History  Problem Relation Age of Onset  . Cancer Mother   . Stroke Father   . Heart disease Father     ROS- All systems are reviewed and negative except as per the HPI above.  Physical Exam: Vitals:   09/24/17 1333  BP: 102/64  Pulse: (!) 129  Weight: 106 lb (48.1 kg)  Height: 5\' 3"  (1.6 m)    GEN- The patient is elderly and frail appearing, alert and oriented x 3 today.   Head- normocephalic, atraumatic Eyes-  Sclera clear, conjunctiva pink Ears- hearing intact Oropharynx- clear Neck- supple  Lungs- Clear to ausculation bilaterally, normal work of breathing Heart- Tachycardic irregular rate and rhythm  GI- soft, NT, ND, + BS Extremities- no clubbing, cyanosis, or edema MS- no significant deformity or atrophy Skin- no rash or lesion Psych- euthymic mood, full affect Neuro- strength and sensation are intact  Wt Readings from Last 3 Encounters:  09/24/17 106 lb (48.1 kg)  07/30/17 104 lb (47.2 kg)   07/21/17 106 lb (48.1 kg)    EKG today demonstrates atrial fibrillation, PVC's, rate 129  Epic records are reviewed at length today  Assessment and Plan:  1. Persistent atrial fibrillation Relatively asymptomatic although fatigue worse since last office visit correlating with increase in V rates Will decrease Diltiazem to 120mg  daily and add Metoprolol 12.5mg  twice daily. She does not have a lot of room with blood pressure for med titration Continue Eliquis for CHADS2VASC  of 3  2.  Chronic diastolic heart failure Improved Continue current therapy   Follow up AF clinic 6 weeks  Chanetta Marshall, NP 09/24/2017 2:36 PM

## 2017-10-08 IMAGING — DX DG CHEST 2V
2 series · 2 of 2 positions shown · non-contrast
Comparison: None.

CLINICAL DATA: Prolonged cough.

EXAM:
CHEST  2 VIEW

[dg chest 2 view (1 of 2)]
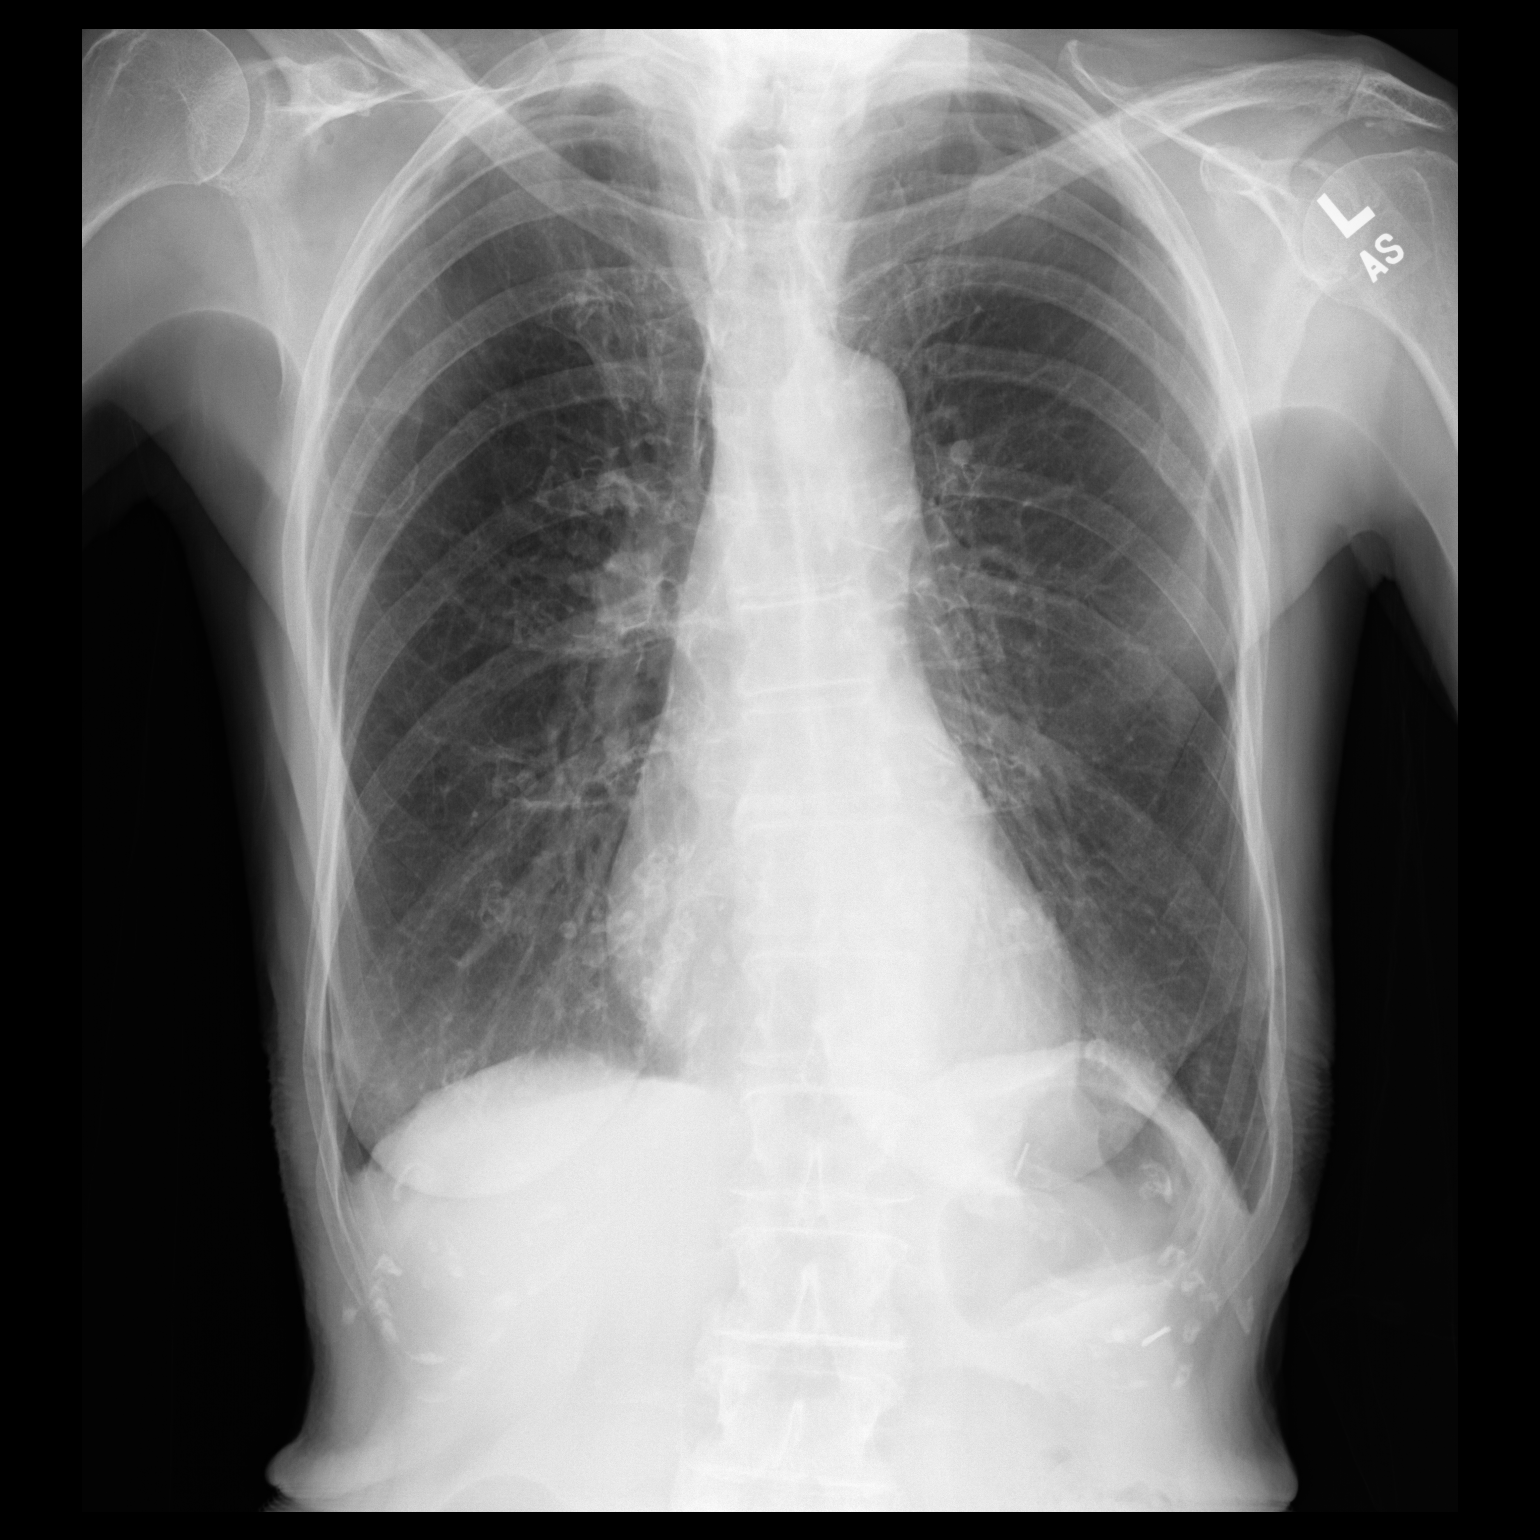

[dg chest 2 view (2 of 2)]
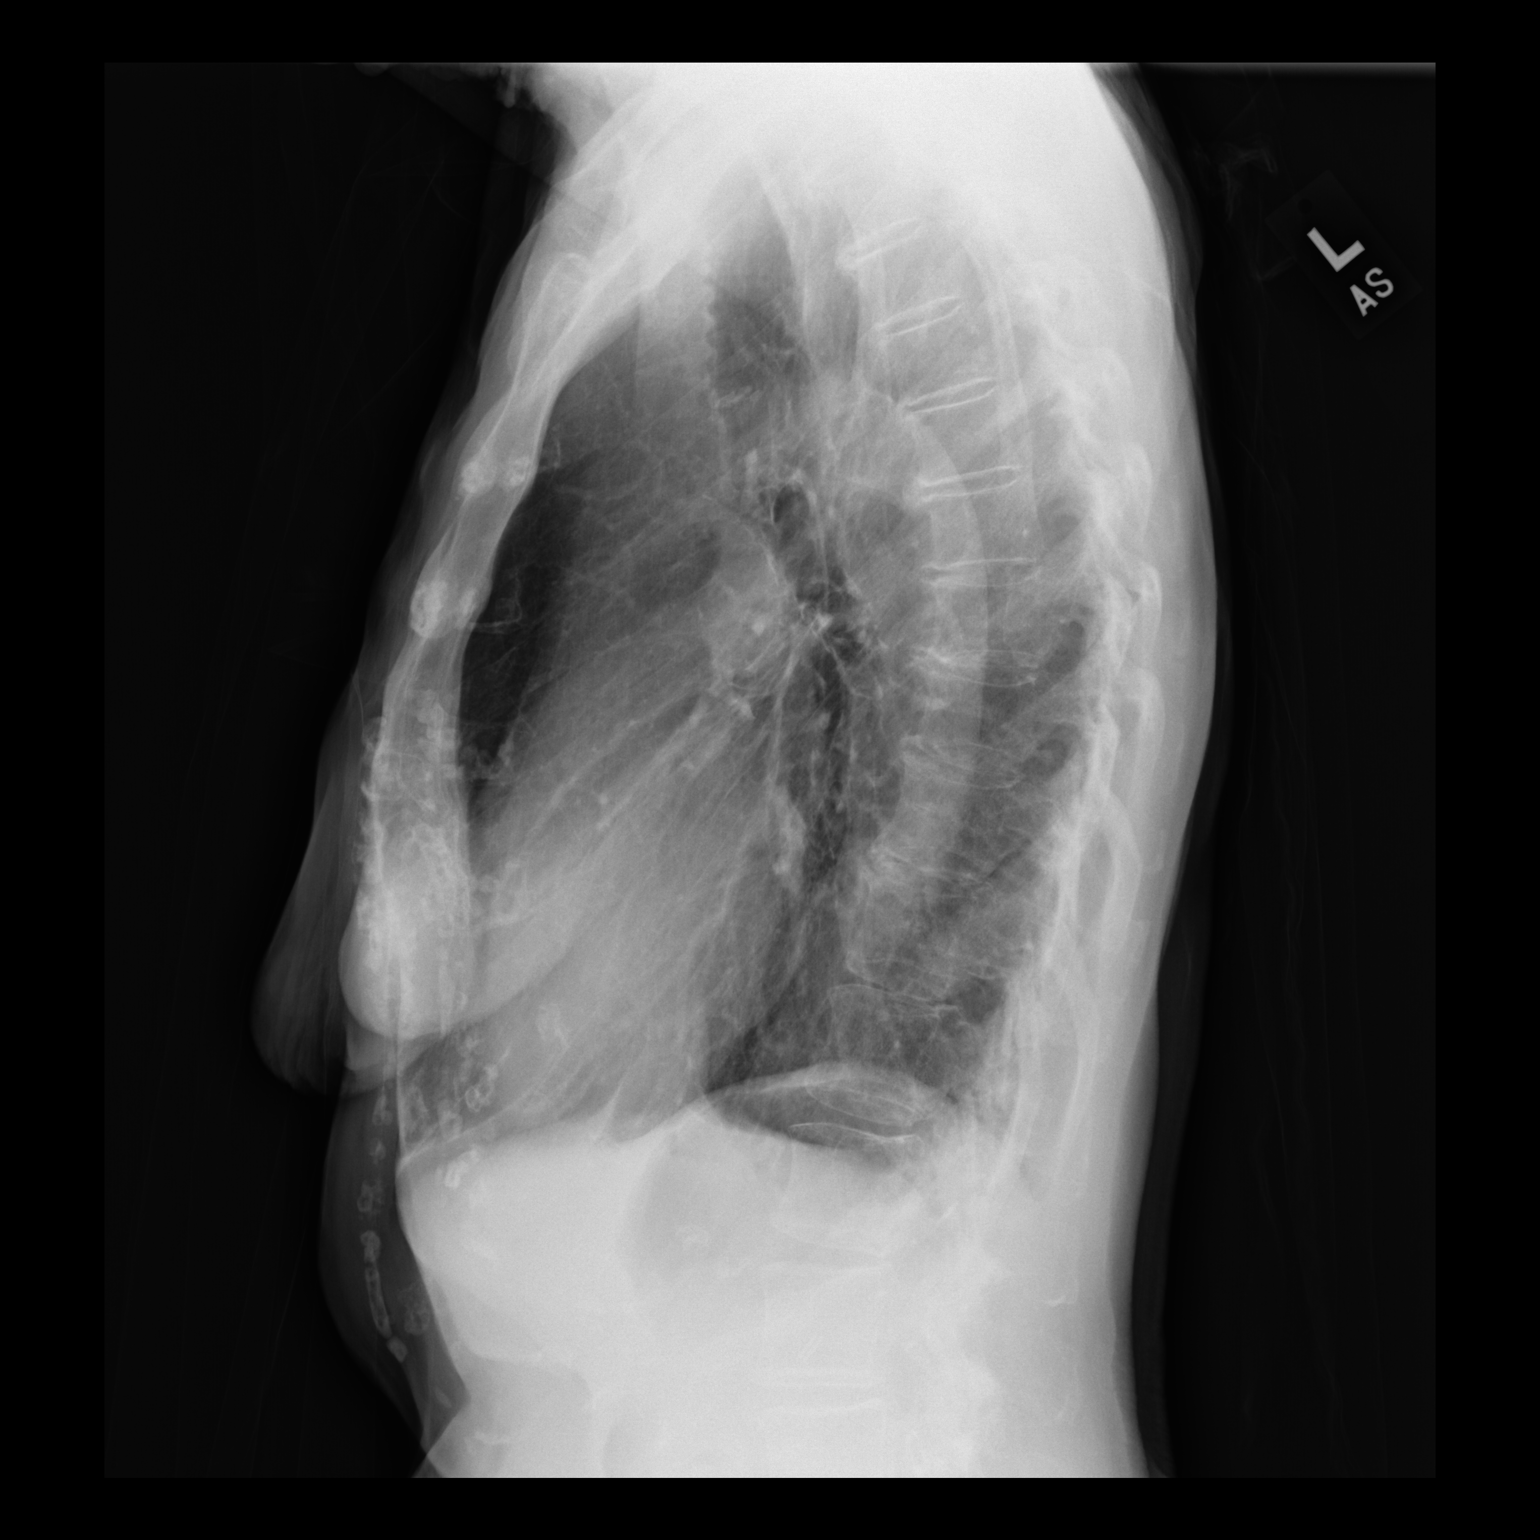

[2 of 2 positions shown; findings below may reference images not displayed]

FINDINGS: The heart size and mediastinal contours are within normal limits.

The lungs are clear without evidence for focal opacity. No sizable
effusion or pneumothorax.

The visualized skeletal structures are unremarkable.
IMPRESSION: No acute cardiopulmonary disease.

## 2017-11-05 ENCOUNTER — Ambulatory Visit (HOSPITAL_COMMUNITY)
Admission: RE | Admit: 2017-11-05 | Discharge: 2017-11-05 | Disposition: A | Discharge status: Home / Self Care | Payer: Medicare Other | Source: Ambulatory Visit | Attending: Nurse Practitioner | Admitting: Nurse Practitioner

## 2017-11-05 VITALS — BP 98/54 | HR 102 | Ht 63.0 in | Wt 106.6 lb

## 2017-11-05 DIAGNOSIS — I4819 Other persistent atrial fibrillation: Secondary | ICD-10-CM

## 2017-11-05 DIAGNOSIS — Z96641 Presence of right artificial hip joint: Secondary | ICD-10-CM | POA: Diagnosis not present

## 2017-11-05 DIAGNOSIS — Z885 Allergy status to narcotic agent status: Secondary | ICD-10-CM | POA: Diagnosis not present

## 2017-11-05 DIAGNOSIS — Z85038 Personal history of other malignant neoplasm of large intestine: Secondary | ICD-10-CM | POA: Diagnosis not present

## 2017-11-05 DIAGNOSIS — R600 Localized edema: Secondary | ICD-10-CM | POA: Insufficient documentation

## 2017-11-05 DIAGNOSIS — Z882 Allergy status to sulfonamides status: Secondary | ICD-10-CM | POA: Diagnosis not present

## 2017-11-05 DIAGNOSIS — Z79899 Other long term (current) drug therapy: Secondary | ICD-10-CM | POA: Diagnosis not present

## 2017-11-05 DIAGNOSIS — Z8249 Family history of ischemic heart disease and other diseases of the circulatory system: Secondary | ICD-10-CM | POA: Insufficient documentation

## 2017-11-05 DIAGNOSIS — Z7901 Long term (current) use of anticoagulants: Secondary | ICD-10-CM | POA: Diagnosis not present

## 2017-11-05 DIAGNOSIS — I471 Supraventricular tachycardia: Secondary | ICD-10-CM | POA: Insufficient documentation

## 2017-11-05 DIAGNOSIS — I481 Persistent atrial fibrillation: Secondary | ICD-10-CM

## 2017-11-05 DIAGNOSIS — Z823 Family history of stroke: Secondary | ICD-10-CM | POA: Diagnosis not present

## 2017-11-05 DIAGNOSIS — Z881 Allergy status to other antibiotic agents status: Secondary | ICD-10-CM | POA: Insufficient documentation

## 2017-11-05 DIAGNOSIS — I480 Paroxysmal atrial fibrillation: Secondary | ICD-10-CM | POA: Insufficient documentation

## 2017-11-05 DIAGNOSIS — R9431 Abnormal electrocardiogram [ECG] [EKG]: Secondary | ICD-10-CM | POA: Diagnosis not present

## 2017-11-05 DIAGNOSIS — Z809 Family history of malignant neoplasm, unspecified: Secondary | ICD-10-CM | POA: Insufficient documentation

## 2017-11-05 DIAGNOSIS — I4891 Unspecified atrial fibrillation: Secondary | ICD-10-CM | POA: Diagnosis present

## 2017-11-05 DIAGNOSIS — Z886 Allergy status to analgesic agent status: Secondary | ICD-10-CM | POA: Diagnosis not present

## 2017-11-05 DIAGNOSIS — M8580 Other specified disorders of bone density and structure, unspecified site: Secondary | ICD-10-CM | POA: Diagnosis not present

## 2017-11-05 DIAGNOSIS — F0390 Unspecified dementia without behavioral disturbance: Secondary | ICD-10-CM | POA: Insufficient documentation

## 2017-11-05 MED ORDER — FUROSEMIDE 20 MG PO TABS: ORAL_TABLET | ORAL | 2 refills | Status: DC

## 2017-11-05 MED ORDER — METOPROLOL SUCCINATE ER 25 MG PO TB24: 12.5000 mg | ORAL_TABLET | Freq: Every day | ORAL | 2 refills | Status: DC

## 2017-11-05 NOTE — Progress Notes (Signed)
Primary Care Physician: Lajean Manes, MD Referring Physician: [name] is a 84 y.o. female with a h/o dementia, h/o paroxysmal  afib that is in the afib clinic for f/u from appointment with Dr. [name] last week. She was in afib with RVR at that time. Cardizem was increased to 180 mg qd from 120 mg a day. She had mild lower extremity edema.  It was anticipated that if swelling did not improve that she may need lasix.  She is in rapid afib today at 127 bpm. The daughter states that she does live alone and she is not sure if she is taking her meds correctly, even though someone does try to  pour meds for her..She has had increased swelling of her extremities over the last week. Pt is not aware of the irregular heart beat but the swelling is concerning to her. She states that she is taking eliquis 2.5 mg bid, pt started these drugs on last visit with Dr. [name] as well. Denies bleeding issues.  F/u in afib clinc, 4/24. Pt is rate controlled today, is not symptomatic with afib and has lost 2 lbs last week, despite lasix being reduced to 10 mg a day. Her lower extremities are the smallest I have seen them.   F/u ina fib clinc, 7/31, since last visit, Cardizem was decreased to 120 mg daily and low dose BB was added. Her daughter states that she has c/o of being fatigued since last visit. BP is low today and no edema is noted. Will use lasix now as needed and will change metoprolol tartrate  to succinate at 12.5 mg at hs.  Today, she denies symptoms of palpitations, chest pain, shortness of breath, orthopnea, PND, dizziness, presyncope, syncope, or neurologic sequela. The patient is tolerating medications without difficulties and is otherwise without complaint today.   Past Medical History:  Diagnosis Date  . Colon cancer ([place]) 2002   s/p colonic resection- chemotherapy and surgery  . Depressed   . DJD (degenerative joint disease)   . Fuchs' corneal dystrophy 2002  . Hernia,  inguinal, right   . ITP (idiopathic thrombocytopenic purpura)    s/p splenectomy- remission  . Osteopenia   . Paroxysmal atrial fibrillation (HCC)    hx SVT  . PONV (postoperative nausea and vomiting)   . SVT (supraventricular tachycardia) ([place])   . Urticaria    Past Surgical History:  Procedure Laterality Date  . ABDOMINAL HYSTERECTOMY    . COLON SURGERY    . EYE SURGERY     corneal replacements and cataracts removed.  Marland Kitchen ROTATOR CUFF REPAIR Right   . SMALL INTESTINE SURGERY    . SPLENECTOMY  1999  . TOTAL HIP ARTHROPLASTY Right 11/01/2014   Procedure: RIGHT TOTAL HIP ARTHROPLASTY ANTERIOR APPROACH;  Surgeon: Paralee Cancel, MD;  Location: [facility];  Service: Orthopedics;  Laterality: Right;    Current Outpatient Medications  Medication Sig Dispense Refill  . apixaban (ELIQUIS) 2.5 MG TABS tablet Take 1 tablet (2.5 mg total) by mouth 2 (two) times daily. 180 tablet 1  . CALCIUM PO Take 1 tablet by mouth daily.    . Cholecalciferol (VITAMIN D3 PO) Take 1 capsule by mouth daily.    Marland Kitchen diltiazem (CARDIZEM CD) 120 MG 24 hr capsule Take 1 capsule (120 mg total) by mouth daily. 30 capsule 3  . donepezil (ARICEPT) 5 MG tablet Take 5 mg by mouth at bedtime.    . furosemide (LASIX) 20 MG tablet Take  0.5 tablets (10 mg total) by mouth daily. 30 tablet 2  . metoprolol tartrate (LOPRESSOR) 25 MG tablet Take 0.5 tablets (12.5 mg total) by mouth 2 (two) times daily. 30 tablet 3  . mirtazapine (REMERON) 15 MG tablet Take 15 mg by mouth at bedtime.  0   No current facility-administered medications for this encounter.     Allergies  Allergen Reactions  . Codeine Nausea Only  . Aspirin     ITP  . Campho-Phenique [Phenol]     unknown  . Ciprofloxacin Hives  . Nsaids     Lower BP  . Sulfa Antibiotics     Cannot remember    Social History   Socioeconomic History  . Marital status: Widowed    Spouse name: Not on file  . Number of children: Not on file  . Years of education: Not on file    . Highest education level: Not on file  Occupational History  . Not on file  Social Needs  . Financial resource strain: Not on file  . Food insecurity:    Worry: Not on file    Inability: Not on file  . Transportation needs:    Medical: Not on file    Non-medical: Not on file  Tobacco Use  . Smoking status: Never Smoker  . Smokeless tobacco: Never Used  Substance and Sexual Activity  . Alcohol use: Yes    Alcohol/week: 4.2 oz    Types: 7 Standard drinks or equivalent per week    Comment: small glass of wine each day  . Drug use: No  . Sexual activity: Never    Birth control/protection: None  Lifestyle  . Physical activity:    Days per week: Not on file    Minutes per session: Not on file  . Stress: Not on file  Relationships  . Social connections:    Talks on phone: Not on file    Gets together: Not on file    Attends religious service: Not on file    Active member of club or organization: Not on file    Attends meetings of clubs or organizations: Not on file    Relationship status: Not on file  . Intimate partner violence:    Fear of current or ex partner: Not on file    Emotionally abused: Not on file    Physically abused: Not on file    Forced sexual activity: Not on file  Other Topics Concern  . Not on file  Social History Narrative   Lives in [place] alone.   Retired Network engineer       Family History  Problem Relation Age of Onset  . Cancer Mother   . Stroke Father   . Heart disease Father     ROS- All systems are reviewed and negative except as per the HPI above  Physical Exam: Vitals:   11/05/17 1339  BP: (!) 98/54  Pulse: (!) 102  Weight: 106 lb 9.6 oz (48.4 kg)  Height: 5\' 3"  (1.6 m)   Wt Readings from Last 3 Encounters:  11/05/17 106 lb 9.6 oz (48.4 kg)  09/24/17 106 lb (48.1 kg)  07/30/17 104 lb (47.2 kg)    Labs: Lab Results  Component Value Date   NA 139 07/21/2017   K 4.1 07/21/2017   CL 102 07/21/2017   CO2 26 07/21/2017    GLUCOSE 95 07/21/2017   BUN 18 07/21/2017   CREATININE 1.05 (H) 07/21/2017   CALCIUM 8.9 07/21/2017  Lab Results  Component Value Date   INR 0.95 10/26/2014   No results found for: CHOL, HDL, LDLCALC, TRIG   GEN- The patient is elderly appearing, alert and oriented x 3 today, HOH.   Head- normocephalic, atraumatic Eyes-  Sclera clear, conjunctiva pink Ears- hearing intact Oropharynx- clear Neck- supple, no JVP Lymph- no cervical lymphadenopathy Lungs- Clear to ausculation bilaterally, normal work of breathing Heart- irregular rate and rhythm, no murmurs, rubs or gallops, PMI not laterally displaced GI- soft, NT, ND, + BS Extremities- no clubbing, cyanosis, or  Trace edema, support hose in place MS- no significant deformity or atrophy Skin- no rash or lesion Psych- euthymic mood, full affect Neuro- strength and sensation are intact  EKG-afib at 102 bpm, qrs int 74 ms, qtc 484 ms    Assessment and Plan: 1. Paroxsymal  afib Continue 120 mg Cardizem daily Will make lasix as needed for 3-5 lb weight gain over 1-3 days or significant increase in LLE as BP is low today and ankle edema seems resolved Change metoprolol tartrate 12.5 bid to succinate at 25 mg 1/2 q hs as I feel she does need this for adequate rate control but change may help with fatigue   2. Chadsvasc  score of 3 Continue Eliquis at 2.5 mg bid Denies bleeding issues    F/u in one month   Butch Penny C. Thoma Paulsen, [facility] [address] [place], [place] [address] [phone]

## 2017-11-05 NOTE — Patient Instructions (Signed)
STOP metoprolol tartrate   START Metoprolol Succinate 25 mg, taking 12.5 mg (1/2 tablet) every night at bedtime.    Stop daily lasix.   Take lasix 10 mg, half of your 20 mg tablet for 3 days IF you notice excessive ankle swelling or have a sudden weight gain of 3 or more lbs.

## 2017-11-14 ENCOUNTER — Other Ambulatory Visit: Payer: Self-pay | Admitting: Internal Medicine

## 2017-12-03 ENCOUNTER — Encounter (HOSPITAL_COMMUNITY): Payer: Self-pay | Admitting: Nurse Practitioner

## 2017-12-03 ENCOUNTER — Ambulatory Visit (HOSPITAL_COMMUNITY)
Admission: RE | Admit: 2017-12-03 | Discharge: 2017-12-03 | Disposition: A | Discharge status: Home / Self Care | Payer: Medicare Other | Source: Ambulatory Visit | Attending: Nurse Practitioner | Admitting: Nurse Practitioner

## 2017-12-03 VITALS — BP 102/58 | HR 95 | Ht 63.0 in | Wt 111.6 lb

## 2017-12-03 DIAGNOSIS — Z885 Allergy status to narcotic agent status: Secondary | ICD-10-CM | POA: Insufficient documentation

## 2017-12-03 DIAGNOSIS — Z886 Allergy status to analgesic agent status: Secondary | ICD-10-CM | POA: Insufficient documentation

## 2017-12-03 DIAGNOSIS — I481 Persistent atrial fibrillation: Secondary | ICD-10-CM | POA: Insufficient documentation

## 2017-12-03 DIAGNOSIS — F0390 Unspecified dementia without behavioral disturbance: Secondary | ICD-10-CM | POA: Diagnosis not present

## 2017-12-03 DIAGNOSIS — Z85038 Personal history of other malignant neoplasm of large intestine: Secondary | ICD-10-CM | POA: Diagnosis not present

## 2017-12-03 DIAGNOSIS — Z882 Allergy status to sulfonamides status: Secondary | ICD-10-CM | POA: Insufficient documentation

## 2017-12-03 DIAGNOSIS — F329 Major depressive disorder, single episode, unspecified: Secondary | ICD-10-CM | POA: Insufficient documentation

## 2017-12-03 DIAGNOSIS — I4819 Other persistent atrial fibrillation: Secondary | ICD-10-CM

## 2017-12-03 DIAGNOSIS — M1990 Unspecified osteoarthritis, unspecified site: Secondary | ICD-10-CM | POA: Insufficient documentation

## 2017-12-03 DIAGNOSIS — Z7901 Long term (current) use of anticoagulants: Secondary | ICD-10-CM | POA: Insufficient documentation

## 2017-12-03 DIAGNOSIS — Z881 Allergy status to other antibiotic agents status: Secondary | ICD-10-CM | POA: Insufficient documentation

## 2017-12-03 DIAGNOSIS — R9431 Abnormal electrocardiogram [ECG] [EKG]: Secondary | ICD-10-CM | POA: Diagnosis not present

## 2017-12-03 DIAGNOSIS — M8580 Other specified disorders of bone density and structure, unspecified site: Secondary | ICD-10-CM | POA: Diagnosis not present

## 2017-12-03 DIAGNOSIS — Z79899 Other long term (current) drug therapy: Secondary | ICD-10-CM | POA: Insufficient documentation

## 2017-12-03 DIAGNOSIS — R600 Localized edema: Secondary | ICD-10-CM | POA: Diagnosis not present

## 2017-12-03 NOTE — Progress Notes (Signed)
Primary Care Physician: Lajean Manes, MD Referring Physician: [name] is a 84 y.o. female with a h/o dementia, h/o persistent afib that is in the afib clinic for f/u from appointment with Dr. [name] last week. She was in afib with RVR at that time. Cardizem was increased to 180 mg qd from 120 mg a day. She had mild lower extremity edema.  It was anticipated that if swelling did not improve that she may need lasix.  She had  rapid afib   at 127 bpm on f/u.Marland Kitchen The daughter states that she does live alone and she is not sure if she is taking her meds correctly, even though someone does try to  pour meds for her.She has had increased swelling of her extremities over the last week. Pt is not aware of the irregular heart beat but the swelling is concerning to her. She states that she is taking eliquis 2.5 mg bid, pt started these drugs on last visit with Dr. [name] as well. Denies bleeding issues.  F/u in afib clinc, 4/24. Pt  rate controlled today, is not symptomatic with afib and has lost 2 lbs last week, despite lasix being reduced to 10 mg a day. Her lower extremities are the smallest I have seen them.   F/u ina fib clinc, 7/31, since last visit, Cardizem was decreased to 120 mg daily and low dose BB was added. Her daughter states that she has c/o of being fatigued since last visit. BP is low today and no edema is noted. Will use lasix now as needed and will change metoprolol tartrate  to succinate at 12.5 mg at hs.  F/u 8/28, her weight is up 4 lbs since stopping lasix a month ago but fluid in not visible and pt is not having any shortness of breath.the daughter is wanting to see if pt would be ok to stop BB, as she feels it is making her fatigued.  Today, she denies symptoms of palpitations, chest pain, shortness of breath, orthopnea, PND, dizziness, presyncope, syncope, or neurologic sequela.+ fatigue. The patient is tolerating medications without difficulties and is  otherwise without complaint today.   Past Medical History:  Diagnosis Date  . Colon cancer ([place]) 2002   s/p colonic resection- chemotherapy and surgery  . Depressed   . DJD (degenerative joint disease)   . Fuchs' corneal dystrophy 2002  . Hernia, inguinal, right   . ITP (idiopathic thrombocytopenic purpura)    s/p splenectomy- remission  . Osteopenia   . Paroxysmal atrial fibrillation (HCC)    hx SVT  . PONV (postoperative nausea and vomiting)   . SVT (supraventricular tachycardia) ([place])   . Urticaria    Past Surgical History:  Procedure Laterality Date  . ABDOMINAL HYSTERECTOMY    . COLON SURGERY    . EYE SURGERY     corneal replacements and cataracts removed.  Marland Kitchen ROTATOR CUFF REPAIR Right   . SMALL INTESTINE SURGERY    . SPLENECTOMY  1999  . TOTAL HIP ARTHROPLASTY Right 11/01/2014   Procedure: RIGHT TOTAL HIP ARTHROPLASTY ANTERIOR APPROACH;  Surgeon: Paralee Cancel, MD;  Location: [facility];  Service: Orthopedics;  Laterality: Right;    Current Outpatient Medications  Medication Sig Dispense Refill  . apixaban (ELIQUIS) 2.5 MG TABS tablet Take 1 tablet (2.5 mg total) by mouth 2 (two) times daily. 180 tablet 1  . CALCIUM PO Take 1 tablet by mouth daily.    Marland Kitchen CARTIA XT 120 MG 24  hr capsule TAKE 1 CAPSULE BY MOUTH ONCE DAILY 90 capsule 2  . Cholecalciferol (VITAMIN D3 PO) Take 1 capsule by mouth daily.    Marland Kitchen donepezil (ARICEPT) 5 MG tablet Take 5 mg by mouth at bedtime.    . furosemide (LASIX) 20 MG tablet Take 1/2 tablet as needed for 3 days if weight gain of 3 or more lbs over a short period of time or excessive ankle swelling. 30 tablet 2  . mirtazapine (REMERON) 15 MG tablet Take 15 mg by mouth at bedtime.  0   No current facility-administered medications for this encounter.     Allergies  Allergen Reactions  . Codeine Nausea Only  . Aspirin     ITP  . Campho-Phenique [Phenol]     unknown  . Ciprofloxacin Hives  . Nsaids     Lower BP  . Sulfa Antibiotics     Cannot  remember    Social History   Socioeconomic History  . Marital status: Widowed    Spouse name: Not on file  . Number of children: Not on file  . Years of education: Not on file  . Highest education level: Not on file  Occupational History  . Not on file  Social Needs  . Financial resource strain: Not on file  . Food insecurity:    Worry: Not on file    Inability: Not on file  . Transportation needs:    Medical: Not on file    Non-medical: Not on file  Tobacco Use  . Smoking status: Never Smoker  . Smokeless tobacco: Never Used  Substance and Sexual Activity  . Alcohol use: Yes    Alcohol/week: 7.0 standard drinks    Types: 7 Standard drinks or equivalent per week    Comment: small glass of wine each day  . Drug use: No  . Sexual activity: Never    Birth control/protection: None  Lifestyle  . Physical activity:    Days per week: Not on file    Minutes per session: Not on file  . Stress: Not on file  Relationships  . Social connections:    Talks on phone: Not on file    Gets together: Not on file    Attends religious service: Not on file    Active member of club or organization: Not on file    Attends meetings of clubs or organizations: Not on file    Relationship status: Not on file  . Intimate partner violence:    Fear of current or ex partner: Not on file    Emotionally abused: Not on file    Physically abused: Not on file    Forced sexual activity: Not on file  Other Topics Concern  . 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   Retired Network engineer       Family History  Problem Relation Age of Onset  . Cancer Mother   . Stroke Father   . Heart disease Father     ROS- All systems are reviewed and negative except as per the HPI above  Physical Exam: Vitals:   12/03/17 1451  BP: (!) 102/58  Pulse: 95  Weight: 50.6 kg  Height: 5\' 3"  (1.6 m)   Wt Readings from Last 3 Encounters:  12/03/17 50.6 kg  11/05/17 48.4 kg    09/24/17 48.1 kg    Labs: Lab Results  Component Value Date   NA 139 07/21/2017   K 4.1 07/21/2017  CL 102 07/21/2017   CO2 26 07/21/2017   GLUCOSE 95 07/21/2017   BUN 18 07/21/2017   CREATININE 1.05 (H) 07/21/2017   CALCIUM 8.9 07/21/2017   Lab Results  Component Value Date   INR 0.95 10/26/2014   No results found for: CHOL, HDL, LDLCALC, TRIG   GEN- The patient is elderly appearing, alert and oriented x 3 today, HOH.   Head- normocephalic, atraumatic Eyes-  Sclera clear, conjunctiva pink Ears- hearing intact Oropharynx- clear Neck- supple, no JVP Lymph- no cervical lymphadenopathy Lungs- Clear to ausculation bilaterally, normal work of breathing Heart- irregular rate and rhythm, no murmurs, rubs or gallops, PMI not laterally displaced GI- soft, NT, ND, + BS Extremities- no clubbing, cyanosis, or  Trace edema, support hose in place MS- no significant deformity or atrophy Skin- no rash or lesion Psych- euthymic mood, full affect Neuro- strength and sensation are intact  EKG-afib at 95  bpm, qrs int 72 ms, qtc 464 ms    Assessment and Plan: 1. Persistent  afib Continue 120 mg Cardizem daily Lasix as needed for 3-5 lb weight gain over 1-3 days or significant increase in LLE  Can try to stop BB x 2 weeks but  want to see back in 2 weeks to make sure adequately rate controlled  2. Chadsvasc  score of 3 Continue Eliquis at 2.5 mg bid Denies bleeding issues   Geroge Baseman. Ranay Ketter, [facility] [address] [place], [place] [address] [phone]

## 2017-12-03 NOTE — Patient Instructions (Signed)
Stop metoprolol

## 2017-12-17 ENCOUNTER — Ambulatory Visit (HOSPITAL_COMMUNITY)
Admission: RE | Admit: 2017-12-17 | Discharge: 2017-12-17 | Disposition: A | Discharge status: Home / Self Care | Payer: Medicare Other | Source: Ambulatory Visit | Attending: Nurse Practitioner | Admitting: Nurse Practitioner

## 2017-12-17 ENCOUNTER — Encounter (HOSPITAL_COMMUNITY): Payer: Self-pay | Admitting: Nurse Practitioner

## 2017-12-17 VITALS — BP 122/66 | HR 95 | Ht 63.0 in | Wt 111.0 lb

## 2017-12-17 DIAGNOSIS — M1990 Unspecified osteoarthritis, unspecified site: Secondary | ICD-10-CM | POA: Diagnosis not present

## 2017-12-17 DIAGNOSIS — I481 Persistent atrial fibrillation: Secondary | ICD-10-CM | POA: Diagnosis not present

## 2017-12-17 DIAGNOSIS — Z7901 Long term (current) use of anticoagulants: Secondary | ICD-10-CM | POA: Insufficient documentation

## 2017-12-17 DIAGNOSIS — F329 Major depressive disorder, single episode, unspecified: Secondary | ICD-10-CM | POA: Diagnosis not present

## 2017-12-17 DIAGNOSIS — M8580 Other specified disorders of bone density and structure, unspecified site: Secondary | ICD-10-CM | POA: Insufficient documentation

## 2017-12-17 DIAGNOSIS — Z9221 Personal history of antineoplastic chemotherapy: Secondary | ICD-10-CM | POA: Insufficient documentation

## 2017-12-17 DIAGNOSIS — Z79899 Other long term (current) drug therapy: Secondary | ICD-10-CM | POA: Diagnosis not present

## 2017-12-17 DIAGNOSIS — Z85038 Personal history of other malignant neoplasm of large intestine: Secondary | ICD-10-CM | POA: Diagnosis not present

## 2017-12-17 DIAGNOSIS — I4819 Other persistent atrial fibrillation: Secondary | ICD-10-CM

## 2017-12-17 NOTE — Progress Notes (Signed)
Primary Care Physician: Gabriella Manes, MD Referring Physician: Dr. Prudence Jackson is a 84 y.o. female with a h/o dementia, h/o persistent afib that is in the afib clinic for f/u from appointment with Dr. Rayann Jackson last week. She was in afib with RVR at that time. Cardizem was increased to 180 mg qd from 120 mg a day. She had mild lower extremity edema.  It was anticipated that if swelling did not improve that she may need lasix.  She had  rapid afib   at 127 bpm on f/u.Marland Kitchen The daughter states that she does live alone and she is not sure if she is taking her meds correctly, even though someone does try to  pour meds for her.She has had increased swelling of her extremities over the last week. Pt is not aware of the irregular heart beat but the swelling is concerning to her. She states that she is taking eliquis 2.5 mg bid, pt started these drugs on last visit with Dr. Rayann Jackson as well. Denies bleeding issues.  F/u in afib clinc, 4/24. Pt  rate controlled today, is not symptomatic with afib and has lost 2 lbs last week, despite lasix being reduced to 10 mg a day. Her lower extremities are the smallest I have seen them.   F/u ina fib clinc, 7/31, since last visit, Cardizem was decreased to 120 mg daily and low dose BB was added. Her daughter states that she has c/o of being fatigued since last visit. BP is low today and no edema is noted. Will use lasix now as needed and will change metoprolol tartrate  to succinate at 12.5 mg at hs.  F/u 8/28, her weight is up 4 lbs since stopping lasix a month ago but fluid in not visible and pt is not having any shortness of breath.the daughter is wanting to see if pt would be ok to stop BB, as she feels it is making her fatigued.  F/u 9/11. I stopped her BB on last visit as her daughter thought it was making her more fatigued.  She has reasonable rate control without it , unsure if fatigue is any better. Weight is staying stable off diuretic.   Today, she  denies symptoms of palpitations, chest pain, shortness of breath, orthopnea, PND, dizziness, presyncope, syncope, or neurologic sequela.+ fatigue. The patient is tolerating medications without difficulties and is otherwise without complaint today.   Past Medical History:  Diagnosis Date  . Colon cancer ([place]) 2002   s/p colonic resection- chemotherapy and surgery  . Depressed   . DJD (degenerative joint disease)   . Fuchs' corneal dystrophy 2002  . Hernia, inguinal, right   . ITP (idiopathic thrombocytopenic purpura)    s/p splenectomy- remission  . Osteopenia   . Paroxysmal atrial fibrillation (HCC)    hx SVT  . PONV (postoperative nausea and vomiting)   . SVT (supraventricular tachycardia) ([place])   . Urticaria    Past Surgical History:  Procedure Laterality Date  . ABDOMINAL HYSTERECTOMY    . COLON SURGERY    . EYE SURGERY     corneal replacements and cataracts removed.  Marland Kitchen ROTATOR CUFF REPAIR Right   . SMALL INTESTINE SURGERY    . SPLENECTOMY  1999  . TOTAL HIP ARTHROPLASTY Right 11/01/2014   Procedure: RIGHT TOTAL HIP ARTHROPLASTY ANTERIOR APPROACH;  Surgeon: Gabriella Cancel, MD;  Location: [facility];  Service: Orthopedics;  Laterality: Right;    Current Outpatient Medications  Medication Sig Dispense Refill  .  apixaban (ELIQUIS) 2.5 MG TABS tablet Take 1 tablet (2.5 mg total) by mouth 2 (two) times daily. 180 tablet 1  . CALCIUM PO Take 1 tablet by mouth daily.    Marland Kitchen CARTIA XT 120 MG 24 hr capsule TAKE 1 CAPSULE BY MOUTH ONCE DAILY 90 capsule 2  . Cholecalciferol (VITAMIN D3 PO) Take 1 capsule by mouth daily.    Marland Kitchen donepezil (ARICEPT) 5 MG tablet Take 5 mg by mouth at bedtime.    . mirtazapine (REMERON) 15 MG tablet Take 15 mg by mouth at bedtime.  0  . furosemide (LASIX) 20 MG tablet Take 1/2 tablet as needed for 3 days if weight gain of 3 or more lbs over a short period of time or excessive ankle swelling. (Patient not taking: Reported on 12/17/2017) 30 tablet 2   No current  facility-administered medications for this encounter.     Allergies  Allergen Reactions  . Codeine Nausea Only  . Aspirin     ITP  . Campho-Phenique [Phenol]     unknown  . Ciprofloxacin Hives  . Nsaids     Lower BP  . Sulfa Antibiotics     Cannot remember    Social History   Socioeconomic History  . Marital status: Widowed    Spouse name: Not on file  . Number of children: Not on file  . Years of education: Not on file  . Highest education level: Not on file  Occupational History  . Not on file  Social Needs  . Financial resource strain: Not on file  . Food insecurity:    Worry: Not on file    Inability: Not on file  . Transportation needs:    Medical: Not on file    Non-medical: Not on file  Tobacco Use  . Smoking status: Never Smoker  . Smokeless tobacco: Never Used  Substance and Sexual Activity  . Alcohol use: Yes    Alcohol/week: 7.0 standard drinks    Types: 7 Standard drinks or equivalent per week    Comment: small glass of wine each day  . Drug use: No  . Sexual activity: Never    Birth control/protection: None  Lifestyle  . Physical activity:    Days per week: Not on file    Minutes per session: Not on file  . Stress: Not on file  Relationships  . Social connections:    Talks on phone: Not on file    Gets together: Not on file    Attends religious service: Not on file    Active member of club or organization: Not on file    Attends meetings of clubs or organizations: Not on file    Relationship status: Not on file  . Intimate partner violence:    Fear of current or ex partner: Not on file    Emotionally abused: Not on file    Physically abused: Not on file    Forced sexual activity: Not on file  Other Topics Concern  . Not on file  Social History Narrative   Lives in [place] alone.   Retired Network engineer       Family History  Problem Relation Age of Onset  . Cancer Mother   . Stroke Father   . Heart disease Father     ROS- All  systems are reviewed and negative except as per the HPI above  Physical Exam: Vitals:   12/17/17 1504  BP: 122/66  Pulse: 95  Weight: 50.3 kg  Height: 5'  3" (1.6 m)   Wt Readings from Last 3 Encounters:  12/17/17 50.3 kg  12/03/17 50.6 kg  11/05/17 48.4 kg    Labs: Lab Results  Component Value Date   NA 139 07/21/2017   K 4.1 07/21/2017   CL 102 07/21/2017   CO2 26 07/21/2017   GLUCOSE 95 07/21/2017   BUN 18 07/21/2017   CREATININE 1.05 (H) 07/21/2017   CALCIUM 8.9 07/21/2017   Lab Results  Component Value Date   INR 0.95 10/26/2014   No results found for: CHOL, HDL, LDLCALC, TRIG   GEN- The patient is elderly appearing, alert and oriented x 3 today, HOH.   Head- normocephalic, atraumatic Eyes-  Sclera clear, conjunctiva pink Ears- hearing intact Oropharynx- clear Neck- supple, no JVP Lymph- no cervical lymphadenopathy Lungs- Clear to ausculation bilaterally, normal work of breathing Heart- irregular rate and rhythm, no murmurs, rubs or gallops, PMI not laterally displaced GI- soft, NT, ND, + BS Extremities- no clubbing, cyanosis, or  Trace edema, support hose in place MS- no significant deformity or atrophy Skin- no rash or lesion Psych- euthymic mood, full affect Neuro- strength and sensation are intact  EKG-afib at 95  bpm, qrs int 72 ms, qtc 452 ms    Assessment and Plan: 1. Persistent  afib Continue 120 mg Cardizem daily Lasix as needed for 3-5 lb weight gain over 1-3 days or significant increase in LLE  Reasonably rate controlled off BB  2. Chadsvasc  score of 3 Continue Eliquis at 2.5 mg bid Denies bleeding issues   F/u in 3 months  Butch Penny C. Carroll, [facility] [address] [place], [place] [address] [phone]

## 2018-01-30 ENCOUNTER — Other Ambulatory Visit (HOSPITAL_COMMUNITY): Payer: Self-pay | Admitting: Nurse Practitioner

## 2018-01-30 DIAGNOSIS — E7800 Pure hypercholesterolemia, unspecified: Secondary | ICD-10-CM | POA: Diagnosis not present

## 2018-01-30 DIAGNOSIS — Z0000 Encounter for general adult medical examination without abnormal findings: Secondary | ICD-10-CM | POA: Diagnosis not present

## 2018-01-30 DIAGNOSIS — F325 Major depressive disorder, single episode, in full remission: Secondary | ICD-10-CM | POA: Diagnosis not present

## 2018-01-30 DIAGNOSIS — I480 Paroxysmal atrial fibrillation: Secondary | ICD-10-CM | POA: Diagnosis not present

## 2018-02-06 ENCOUNTER — Other Ambulatory Visit (HOSPITAL_COMMUNITY): Payer: Self-pay | Admitting: *Deleted

## 2018-02-06 MED ORDER — APIXABAN 2.5 MG PO TABS: 2.5000 mg | ORAL_TABLET | Freq: Two times a day (BID) | ORAL | 1 refills | Status: AC

## 2018-03-18 ENCOUNTER — Encounter (HOSPITAL_COMMUNITY): Payer: Self-pay | Admitting: Nurse Practitioner

## 2018-03-18 ENCOUNTER — Ambulatory Visit (HOSPITAL_COMMUNITY)
Admission: RE | Admit: 2018-03-18 | Discharge: 2018-03-18 | Disposition: A | Discharge status: Home / Self Care | Payer: Medicare Other | Source: Ambulatory Visit | Attending: Nurse Practitioner | Admitting: Nurse Practitioner

## 2018-03-18 ENCOUNTER — Ambulatory Visit (HOSPITAL_COMMUNITY): Payer: Medicare Other | Admitting: Nurse Practitioner

## 2018-03-18 VITALS — BP 108/56 | HR 107 | Ht 63.0 in | Wt 112.0 lb

## 2018-03-18 DIAGNOSIS — Z85038 Personal history of other malignant neoplasm of large intestine: Secondary | ICD-10-CM | POA: Diagnosis not present

## 2018-03-18 DIAGNOSIS — Z96641 Presence of right artificial hip joint: Secondary | ICD-10-CM | POA: Insufficient documentation

## 2018-03-18 DIAGNOSIS — Z8249 Family history of ischemic heart disease and other diseases of the circulatory system: Secondary | ICD-10-CM | POA: Insufficient documentation

## 2018-03-18 DIAGNOSIS — Z882 Allergy status to sulfonamides status: Secondary | ICD-10-CM | POA: Insufficient documentation

## 2018-03-18 DIAGNOSIS — Z881 Allergy status to other antibiotic agents status: Secondary | ICD-10-CM | POA: Diagnosis not present

## 2018-03-18 DIAGNOSIS — F329 Major depressive disorder, single episode, unspecified: Secondary | ICD-10-CM | POA: Diagnosis not present

## 2018-03-18 DIAGNOSIS — Z888 Allergy status to other drugs, medicaments and biological substances status: Secondary | ICD-10-CM | POA: Diagnosis not present

## 2018-03-18 DIAGNOSIS — Z9081 Acquired absence of spleen: Secondary | ICD-10-CM | POA: Insufficient documentation

## 2018-03-18 DIAGNOSIS — I4819 Other persistent atrial fibrillation: Secondary | ICD-10-CM | POA: Insufficient documentation

## 2018-03-18 DIAGNOSIS — Z9889 Other specified postprocedural states: Secondary | ICD-10-CM | POA: Diagnosis not present

## 2018-03-18 DIAGNOSIS — Z7901 Long term (current) use of anticoagulants: Secondary | ICD-10-CM | POA: Insufficient documentation

## 2018-03-18 DIAGNOSIS — Z885 Allergy status to narcotic agent status: Secondary | ICD-10-CM | POA: Diagnosis not present

## 2018-03-18 DIAGNOSIS — F0390 Unspecified dementia without behavioral disturbance: Secondary | ICD-10-CM | POA: Diagnosis not present

## 2018-03-18 DIAGNOSIS — Z79899 Other long term (current) drug therapy: Secondary | ICD-10-CM | POA: Insufficient documentation

## 2018-03-18 DIAGNOSIS — Z886 Allergy status to analgesic agent status: Secondary | ICD-10-CM | POA: Diagnosis not present

## 2018-03-18 NOTE — Progress Notes (Signed)
Primary Care Physician: Lajean Manes, MD Referring Physician: Dr. Prudence Jackson is a 84 y.o. female with a h/o dementia, h/o persistent afib that is in the afib clinic for f/u from appointment with Dr. Rayann Jackson last week. She was in afib with RVR at that time. Cardizem was increased to 180 mg qd from 120 mg a day. She had mild lower extremity edema.  It was anticipated that if swelling did not improve that she may need lasix.  She had  rapid afib   at 127 bpm on f/u.Marland Kitchen The daughter states that she does live alone and she is not sure if she is taking her meds correctly, even though someone does try to  pour meds for her.She has had increased swelling of her extremities over the last week. Pt is not aware of the irregular heart beat but the swelling is concerning to her. She states that she is taking eliquis 2.5 mg bid, pt started these drugs on last visit with Dr. Rayann Jackson as well. Denies bleeding issues.  F/u in afib clinc, 4/24. Pt  rate controlled today, is not symptomatic with afib and has lost 2 lbs last week, despite lasix being reduced to 10 mg a day. Her lower extremities are the smallest I have seen them.   F/u ina fib clinc, 7/31, since last visit, Cardizem was decreased to 120 mg daily and low dose BB was added. Her daughter states that she has c/o of being fatigued since last visit. BP is low today and no edema is noted. Will use lasix now as needed and will change metoprolol tartrate  to succinate at 12.5 mg at hs.  F/u 8/28, her weight is up 4 lbs since stopping lasix a month ago but fluid in not visible and pt is not having any shortness of breath.the daughter is wanting to see if pt would be ok to stop BB, as she feels it is making her fatigued.  F/u 9/11. I stopped her BB on last visit as her daughter thought it was making her more fatigued.  She has reasonable rate control without it , unsure if fatigue is any better. Weight is staying stable off diuretic.   F/u 12/11.  She appears stable. Weight is stable. No awareness of aifb. + for dementia. Here with daughter.has reasonable rate control. Feels better off BB.  Today, she denies symptoms of palpitations, chest pain, shortness of breath, orthopnea, PND, dizziness, presyncope, syncope, or neurologic sequela.   The patient is tolerating medications without difficulties and is otherwise without complaint today.   Past Medical History:  Diagnosis Date  . Colon cancer ([place]) 2002   s/p colonic resection- chemotherapy and surgery  . Depressed   . DJD (degenerative joint disease)   . Fuchs' corneal dystrophy 2002  . Hernia, inguinal, right   . ITP (idiopathic thrombocytopenic purpura)    s/p splenectomy- remission  . Osteopenia   . Paroxysmal atrial fibrillation (HCC)    hx SVT  . PONV (postoperative nausea and vomiting)   . SVT (supraventricular tachycardia) ([place])   . Urticaria    Past Surgical History:  Procedure Laterality Date  . ABDOMINAL HYSTERECTOMY    . COLON SURGERY    . EYE SURGERY     corneal replacements and cataracts removed.  Marland Kitchen ROTATOR CUFF REPAIR Right   . SMALL INTESTINE SURGERY    . SPLENECTOMY  1999  . TOTAL HIP ARTHROPLASTY Right 11/01/2014   Procedure: RIGHT TOTAL HIP ARTHROPLASTY ANTERIOR  APPROACH;  Surgeon: Paralee Cancel, MD;  Location: [facility];  Service: Orthopedics;  Laterality: Right;    Current Outpatient Medications  Medication Sig Dispense Refill  . apixaban (ELIQUIS) 2.5 MG TABS tablet Take 1 tablet (2.5 mg total) by mouth 2 (two) times daily. 180 tablet 1  . CALCIUM PO Take 1 tablet by mouth daily.    Marland Kitchen CARTIA XT 120 MG 24 hr capsule TAKE 1 CAPSULE BY MOUTH ONCE DAILY 90 capsule 2  . donepezil (ARICEPT) 5 MG tablet Take 5 mg by mouth at bedtime.    . memantine (NAMENDA) 5 MG tablet 1 tablet daily.  3  . mirtazapine (REMERON) 15 MG tablet Take 15 mg by mouth at bedtime.  0   No current facility-administered medications for this encounter.     Allergies  Allergen  Reactions  . Codeine Nausea Only  . Aspirin     ITP  . Campho-Phenique [Phenol]     unknown  . Ciprofloxacin Hives  . Nsaids     Lower BP  . Sulfa Antibiotics     Cannot remember    Social History   Socioeconomic History  . Marital status: Widowed    Spouse name: Not on file  . Number of children: Not on file  . Years of education: Not on file  . Highest education level: Not on file  Occupational History  . Not on file  Social Needs  . Financial resource strain: Not on file  . Food insecurity:    Worry: Not on file    Inability: Not on file  . Transportation needs:    Medical: Not on file    Non-medical: Not on file  Tobacco Use  . Smoking status: Never Smoker  . Smokeless tobacco: Never Used  Substance and Sexual Activity  . Alcohol use: Yes    Alcohol/week: 7.0 standard drinks    Types: 7 Standard drinks or equivalent per week    Comment: small glass of wine each day  . Drug use: No  . Sexual activity: Never    Birth control/protection: None  Lifestyle  . Physical activity:    Days per week: Not on file    Minutes per session: Not on file  . Stress: Not on file  Relationships  . Social connections:    Talks on phone: Not on file    Gets together: Not on file    Attends religious service: Not on file    Active member of club or organization: Not on file    Attends meetings of clubs or organizations: Not on file    Relationship status: Not on file  . Intimate partner violence:    Fear of current or ex partner: Not on file    Emotionally abused: Not on file    Physically abused: Not on file    Forced sexual activity: Not on file  Other Topics Concern  . Not on file  Social History Narrative   Lives in [place] alone.   Retired Network engineer       Family History  Problem Relation Age of Onset  . Cancer Mother   . Stroke Father   . Heart disease Father     ROS- All systems are reviewed and negative except as per the HPI above  Physical  Exam: Vitals:   03/18/18 1346  BP: (!) 108/56  Pulse: (!) 107  Weight: 50.8 kg  Height: 5\' 3"  (1.6 m)   Wt Readings from Last 3 Encounters:  03/18/18  50.8 kg  12/17/17 50.3 kg  12/03/17 50.6 kg    Labs: Lab Results  Component Value Date   NA 139 07/21/2017   K 4.1 07/21/2017   CL 102 07/21/2017   CO2 26 07/21/2017   GLUCOSE 95 07/21/2017   BUN 18 07/21/2017   CREATININE 1.05 (H) 07/21/2017   CALCIUM 8.9 07/21/2017   Lab Results  Component Value Date   INR 0.95 10/26/2014   No results found for: CHOL, HDL, LDLCALC, TRIG   GEN- The patient is elderly appearing, alert and oriented x 3 today, HOH.   Head- normocephalic, atraumatic Eyes-  Sclera clear, conjunctiva pink Ears- hearing intact Oropharynx- clear Neck- supple, no JVP Lymph- no cervical lymphadenopathy Lungs- Clear to ausculation bilaterally, normal work of breathing Heart- irregular rate and rhythm, no murmurs, rubs or gallops, PMI not laterally displaced GI- soft, NT, ND, + BS Extremities- no clubbing, cyanosis, or  Trace edema, support hose in place MS- no significant deformity or atrophy Skin- no rash or lesion Psych- euthymic mood, full affect Neuro- strength and sensation are intact  EKG-afib at 107 bpm, qrs int 72 ms, qtc 464 ms    Assessment and Plan: 1. Persistent  afib Continue 120 mg Cardizem daily Lasix as needed for 3-5 lb weight gain over 1-3 days or significant increase in LLE, her weight has been stable Reasonably rate controlled off BB  2. Chadsvasc  score of 3 Continue Eliquis at 2.5 mg bid Denies bleeding issues   F/u in 6 months  Gabriella Jackson, [facility] [address] [place], [place] [address] [phone]

## 2018-04-15 DIAGNOSIS — F0280 Dementia in other diseases classified elsewhere without behavioral disturbance: Secondary | ICD-10-CM | POA: Diagnosis not present

## 2018-04-15 DIAGNOSIS — G301 Alzheimer's disease with late onset: Secondary | ICD-10-CM | POA: Diagnosis not present

## 2018-04-15 DIAGNOSIS — R5383 Other fatigue: Secondary | ICD-10-CM | POA: Diagnosis not present

## 2018-04-15 DIAGNOSIS — I480 Paroxysmal atrial fibrillation: Secondary | ICD-10-CM | POA: Diagnosis not present

## 2018-06-26 DIAGNOSIS — G301 Alzheimer's disease with late onset: Secondary | ICD-10-CM | POA: Diagnosis not present

## 2018-06-26 DIAGNOSIS — E441 Mild protein-calorie malnutrition: Secondary | ICD-10-CM | POA: Diagnosis not present

## 2018-06-26 DIAGNOSIS — Z79899 Other long term (current) drug therapy: Secondary | ICD-10-CM | POA: Diagnosis not present

## 2018-06-26 DIAGNOSIS — I480 Paroxysmal atrial fibrillation: Secondary | ICD-10-CM | POA: Diagnosis not present

## 2018-08-10 ENCOUNTER — Other Ambulatory Visit: Payer: Self-pay

## 2018-08-10 ENCOUNTER — Ambulatory Visit: Payer: Medicare Other | Admitting: Podiatry

## 2018-08-10 ENCOUNTER — Encounter: Payer: Self-pay | Admitting: Podiatry

## 2018-08-10 VITALS — Temp 98.2°F

## 2018-08-10 DIAGNOSIS — B351 Tinea unguium: Secondary | ICD-10-CM

## 2018-08-10 DIAGNOSIS — Z7901 Long term (current) use of anticoagulants: Secondary | ICD-10-CM | POA: Diagnosis not present

## 2018-08-10 DIAGNOSIS — M79675 Pain in left toe(s): Secondary | ICD-10-CM

## 2018-08-10 DIAGNOSIS — M79674 Pain in right toe(s): Secondary | ICD-10-CM

## 2018-08-10 MED ORDER — CICLOPIROX 8 % EX SOLN: Freq: Every day | CUTANEOUS | 4 refills | Status: AC

## 2018-08-18 NOTE — Progress Notes (Signed)
Subjective:   Patient ID: Gabriella Jackson, female   DOB: 85 y.o.   MRN: [id number]   HPI 85 year old female presents the office with her daughter, Manuela Schwartz, for concerns of thick, discolored toenails that she cannot trim her self.  Is on the nails as well.  She states that she does rub against the nails causing irritation.  She also gets some dry skin the bottom of her feet.  Denies any open sores or any drainage.  No other concerns.  Review of Systems  All other systems reviewed and are negative.  Past Medical History:  Diagnosis Date  . Colon cancer ([place]) 2002   s/p colonic resection- chemotherapy and surgery  . Depressed   . DJD (degenerative joint disease)   . Fuchs' corneal dystrophy 2002  . Hernia, inguinal, right   . ITP (idiopathic thrombocytopenic purpura)    s/p splenectomy- remission  . Osteopenia   . Paroxysmal atrial fibrillation (HCC)    hx SVT  . PONV (postoperative nausea and vomiting)   . SVT (supraventricular tachycardia) ([place])   . Urticaria     Past Surgical History:  Procedure Laterality Date  . ABDOMINAL HYSTERECTOMY    . COLON SURGERY    . EYE SURGERY     corneal replacements and cataracts removed.  Marland Kitchen ROTATOR CUFF REPAIR Right   . SMALL INTESTINE SURGERY    . SPLENECTOMY  1999  . TOTAL HIP ARTHROPLASTY Right 11/01/2014   Procedure: RIGHT TOTAL HIP ARTHROPLASTY ANTERIOR APPROACH;  Surgeon: Paralee Cancel, MD;  Location: [facility];  Service: Orthopedics;  Laterality: Right;     Current Outpatient Medications:  .  apixaban (ELIQUIS) 2.5 MG TABS tablet, Take 1 tablet (2.5 mg total) by mouth 2 (two) times daily., Disp: 180 tablet, Rfl: 1 .  CARTIA XT 120 MG 24 hr capsule, TAKE 1 CAPSULE BY MOUTH ONCE DAILY, Disp: 90 capsule, Rfl: 2 .  ciclopirox (PENLAC) 8 % solution, Apply topically at bedtime. Apply over nail and surrounding skin. Apply daily over previous coat. After seven (7) days, may remove with alcohol and continue cycle., Disp: 6.6 mL, Rfl: 4 .   donepezil (ARICEPT) 5 MG tablet, Take 5 mg by mouth at bedtime., Disp: , Rfl:  .  memantine (NAMENDA) 5 MG tablet, 1 tablet daily. Per daughter now takes 15 mg., Disp: , Rfl: 3  Allergies  Allergen Reactions  . Codeine Nausea Only  . Aspirin     ITP  . Campho-Phenique [Phenol]     unknown  . Ciprofloxacin Hives  . Nsaids     Lower BP  . Sulfa Antibiotics     Cannot remember         Objective:  Physical Exam  General: AAO x3, NAD  Dermatological: Nails are hypertrophic, dystrophic, brittle, discolored, elongated 10. No surrounding redness or drainage. Tenderness nails 1-5 bilaterally. No open lesions or pre-ulcerative lesions are identified today.  Dry skin plantar heels bilaterally.  Vascular: Dorsalis Pedis artery and Posterior Tibial artery pedal pulses are 2/4 bilateral with immedate capillary fill time. There is no pain with calf compression, swelling, warmth, erythema. Mild bilateral ankle edema without any pain, erythema.   Neruologic: Grossly intact via light touch bilateral.   Musculoskeletal: No pain, crepitus, or limitation noted with foot and ankle range of motion bilateral. Muscular strength 5/5 in all groups tested bilateral.    Assessment:   Symptomatic onychomycosis     Plan:  -Treatment options discussed including all alternatives, risks, and complications -Etiology of  symptoms were discussed -Nails debrided 10 without complications or bleeding. -Discussed moisturizer for her feet daily but do not apply interdigitally. -Daily foot inspection -Follow-up in as scheduled for routine care or sooner if any problems arise. In the meantime, encouraged to call the office with any questions, concerns, change in symptoms.   Celesta Gentile, DPM

## 2018-09-07 ENCOUNTER — Ambulatory Visit: Payer: Medicare Other | Admitting: Podiatry

## 2018-09-07 ENCOUNTER — Encounter: Payer: Self-pay | Admitting: Podiatry

## 2018-09-07 ENCOUNTER — Other Ambulatory Visit: Payer: Self-pay

## 2018-09-07 VITALS — Temp 97.7°F

## 2018-09-07 DIAGNOSIS — B351 Tinea unguium: Secondary | ICD-10-CM | POA: Diagnosis not present

## 2018-09-07 DIAGNOSIS — M79674 Pain in right toe(s): Secondary | ICD-10-CM | POA: Diagnosis not present

## 2018-09-07 DIAGNOSIS — M79675 Pain in left toe(s): Secondary | ICD-10-CM

## 2018-09-14 NOTE — Progress Notes (Signed)
Subjective: 85 year old female presents the office today with her daughter for evaluation of thick toenails.  It appears that the caregivers been ongoing on the Penlac few times a week and has not been removed.  There is a buildup on the toenails.  The daughter is very concerned about this.  No pain to the nails and there is no redness or drainage. Denies any systemic complaints such as fevers, chills, nausea, vomiting. No acute changes since last appointment, and no other complaints at this time.   Objective: AAO x3, NAD DP/PT pulses palpable bilaterally, CRT less than 3 seconds There is significant buildup of bilateral hallux toenails from where the Penlac has been applied and not been removed.  The nails are somewhat loose.  I was able to debride the nails remove the old medication.  There is no pain to the area there is no surrounding erythema, edema.  No drainage or pus. No open lesions or pre-ulcerative lesions.  No pain with calf compression, swelling, warmth, erythema  Assessment: Onychomycosis  Plan: -All treatment options discussed with the patient including all alternatives, risks, complications.  -Today debrided the bilateral hallux toenails with any complications or bleeding.  Discussed the use of Penlac.  They are adamant they want to continue treatment but the daughter is very concerned that is not being treated appropriately.  She was asking if she could come in to have the nails filed every so often.  Unfortunately her daughter cannot do see her due to the recent pandemic.  Otherwise she did this herself.  I will have her follow-up with Marylou Mccoy for this.  -Patient encouraged to call the office with any questions, concerns, change in symptoms.   Trula Slade DPM

## 2018-09-15 ENCOUNTER — Other Ambulatory Visit: Payer: Medicare Other

## 2018-09-16 ENCOUNTER — Ambulatory Visit (HOSPITAL_COMMUNITY): Payer: Medicare Other | Admitting: Nurse Practitioner

## 2018-09-18 DIAGNOSIS — E441 Mild protein-calorie malnutrition: Secondary | ICD-10-CM | POA: Diagnosis not present

## 2018-09-18 DIAGNOSIS — I480 Paroxysmal atrial fibrillation: Secondary | ICD-10-CM | POA: Diagnosis not present

## 2018-09-18 DIAGNOSIS — G301 Alzheimer's disease with late onset: Secondary | ICD-10-CM | POA: Diagnosis not present

## 2018-09-22 ENCOUNTER — Other Ambulatory Visit: Payer: Self-pay

## 2018-09-22 ENCOUNTER — Ambulatory Visit: Payer: Medicare Other

## 2018-09-22 DIAGNOSIS — B351 Tinea unguium: Secondary | ICD-10-CM

## 2018-09-23 NOTE — Progress Notes (Signed)
Patient is here to have both great nails smoothed. She is currently using Penlac daily and has no means to remove the built up layer on her nails.   Nails smoothed without incident. Follow up next week

## 2018-10-02 ENCOUNTER — Other Ambulatory Visit: Payer: Medicare Other

## 2018-10-12 ENCOUNTER — Other Ambulatory Visit: Payer: Self-pay

## 2018-10-12 ENCOUNTER — Ambulatory Visit: Payer: Medicare Other

## 2018-10-12 DIAGNOSIS — B351 Tinea unguium: Secondary | ICD-10-CM

## 2018-10-13 ENCOUNTER — Ambulatory Visit (HOSPITAL_COMMUNITY)
Admission: RE | Admit: 2018-10-13 | Discharge: 2018-10-13 | Disposition: A | Discharge status: Home / Self Care | Payer: Medicare Other | Source: Ambulatory Visit | Attending: Nurse Practitioner | Admitting: Nurse Practitioner

## 2018-10-13 ENCOUNTER — Encounter (HOSPITAL_COMMUNITY): Payer: Self-pay | Admitting: Nurse Practitioner

## 2018-10-13 ENCOUNTER — Other Ambulatory Visit: Payer: Self-pay

## 2018-10-13 VITALS — BP 92/48 | HR 86 | Ht 63.0 in | Wt 96.2 lb

## 2018-10-13 DIAGNOSIS — Z79899 Other long term (current) drug therapy: Secondary | ICD-10-CM | POA: Diagnosis not present

## 2018-10-13 DIAGNOSIS — Z85038 Personal history of other malignant neoplasm of large intestine: Secondary | ICD-10-CM | POA: Diagnosis not present

## 2018-10-13 DIAGNOSIS — Z7901 Long term (current) use of anticoagulants: Secondary | ICD-10-CM | POA: Diagnosis not present

## 2018-10-13 DIAGNOSIS — Z885 Allergy status to narcotic agent status: Secondary | ICD-10-CM | POA: Insufficient documentation

## 2018-10-13 DIAGNOSIS — Z96641 Presence of right artificial hip joint: Secondary | ICD-10-CM | POA: Insufficient documentation

## 2018-10-13 DIAGNOSIS — I4821 Permanent atrial fibrillation: Secondary | ICD-10-CM | POA: Diagnosis not present

## 2018-10-13 DIAGNOSIS — Z809 Family history of malignant neoplasm, unspecified: Secondary | ICD-10-CM | POA: Insufficient documentation

## 2018-10-13 DIAGNOSIS — Z8249 Family history of ischemic heart disease and other diseases of the circulatory system: Secondary | ICD-10-CM | POA: Diagnosis not present

## 2018-10-13 DIAGNOSIS — I959 Hypotension, unspecified: Secondary | ICD-10-CM | POA: Insufficient documentation

## 2018-10-13 DIAGNOSIS — Z886 Allergy status to analgesic agent status: Secondary | ICD-10-CM | POA: Diagnosis not present

## 2018-10-13 DIAGNOSIS — Z9081 Acquired absence of spleen: Secondary | ICD-10-CM | POA: Diagnosis not present

## 2018-10-13 DIAGNOSIS — Z882 Allergy status to sulfonamides status: Secondary | ICD-10-CM | POA: Insufficient documentation

## 2018-10-13 DIAGNOSIS — Z888 Allergy status to other drugs, medicaments and biological substances status: Secondary | ICD-10-CM | POA: Diagnosis not present

## 2018-10-13 NOTE — Progress Notes (Signed)
Primary Care Physician: Lajean Manes, MD Referring Physician: [name] is a 85 y.o. female with a h/o dementia, h/o persistent afib that is in the afib clinic for f/u from appointment with Dr. [name] last week. She was in afib with RVR at that time. Cardizem was increased to 180 mg qd from 120 mg a day. She had mild lower extremity edema.  It was anticipated that if swelling did not improve that she may need lasix.  She had  rapid afib   at 127 bpm on f/u.Marland Kitchen The daughter states that she does live alone and she is not sure if she is taking her meds correctly, even though someone does try to  pour meds for her.She has had increased swelling of her extremities over the last week. Pt is not aware of the irregular heart beat but the swelling is concerning to her. She states that she is taking eliquis 2.5 mg bid, pt started these drugs on last visit with Dr. [name] as well. Denies bleeding issues.  F/u in afib clinc, 4/24. Pt  rate controlled today, is not symptomatic with afib and has lost 2 lbs last week, despite lasix being reduced to 10 mg a day. Her lower extremities are the smallest I have seen them.   F/u ina fib clinc, 7/31, since last visit, Cardizem was decreased to 120 mg daily and low dose BB was added. Her daughter states that she has c/o of being fatigued since last visit. BP is low today and no edema is noted. Will use lasix now as needed and will change metoprolol tartrate  to succinate at 12.5 mg at hs.  F/u 8/28, her weight is up 4 lbs since stopping lasix a month ago but fluid in not visible and pt is not having any shortness of breath.the daughter is wanting to see if pt would be ok to stop BB, as she feels it is making her fatigued.  F/u 9/11. I stopped her BB on last visit as her daughter thought it was making her more fatigued.  She has reasonable rate control without it , unsure if fatigue is any better. Weight is staying stable off diuretic.   F/u 12/11.  She appears stable. Weight is stable. No awareness of aifb. + for dementia. Here with daughter.has reasonable rate control. Feels better off BB  F/u in afib clinic, 7/7. She lives in nursing facility and has lost a lot of weight form noro virus late last year and lost a lot of weight that never came back. Now she does not get to go to the dining room to eat with others 2/2 covid and the daughter feels she just doesn't enjoy eating that much any more. Her BP is low but she is not symptomatic with this and states that she feels well. Her afib is rate controlled and no bleeding issues with DOAC    Today, she denies symptoms of palpitations, chest pain, shortness of breath, orthopnea, PND, dizziness, presyncope, syncope, or neurologic sequela.   The patient is tolerating medications without difficulties and is otherwise without complaint today.   Past Medical History:  Diagnosis Date  . Colon cancer ([place]) 2002   s/p colonic resection- chemotherapy and surgery  . Depressed   . DJD (degenerative joint disease)   . Fuchs' corneal dystrophy 2002  . Hernia, inguinal, right   . ITP (idiopathic thrombocytopenic purpura)    s/p splenectomy- remission  . Osteopenia   . Paroxysmal atrial  fibrillation ([facility])    hx SVT  . PONV (postoperative nausea and vomiting)   . SVT (supraventricular tachycardia) ([place])   . Urticaria    Past Surgical History:  Procedure Laterality Date  . ABDOMINAL HYSTERECTOMY    . COLON SURGERY    . EYE SURGERY     corneal replacements and cataracts removed.  Marland Kitchen ROTATOR CUFF REPAIR Right   . SMALL INTESTINE SURGERY    . SPLENECTOMY  1999  . TOTAL HIP ARTHROPLASTY Right 11/01/2014   Procedure: RIGHT TOTAL HIP ARTHROPLASTY ANTERIOR APPROACH;  Surgeon: Paralee Cancel, MD;  Location: [facility];  Service: Orthopedics;  Laterality: Right;    Current Outpatient Medications  Medication Sig Dispense Refill  . apixaban (ELIQUIS) 2.5 MG TABS tablet Take 1 tablet (2.5 mg total) by mouth 2  (two) times daily. 180 tablet 1  . CARTIA XT 120 MG 24 hr capsule TAKE 1 CAPSULE BY MOUTH ONCE DAILY 90 capsule 2  . ciclopirox (PENLAC) 8 % solution Apply topically at bedtime. Apply over nail and surrounding skin. Apply daily over previous coat. After seven (7) days, may remove with alcohol and continue cycle. 6.6 mL 4  . donepezil (ARICEPT) 5 MG tablet Take 5 mg by mouth at bedtime.    . memantine (NAMENDA) 10 MG tablet Take by mouth 2 (two) times daily.     No current facility-administered medications for this encounter.     Allergies  Allergen Reactions  . Codeine Nausea Only  . Aspirin     ITP  . Campho-Phenique [Phenol]     unknown  . Ciprofloxacin Hives  . Nsaids     Lower BP  . Sulfa Antibiotics     Cannot remember    Social History   Socioeconomic History  . Marital status: Widowed    Spouse name: Not on file  . Number of children: Not on file  . Years of education: Not on file  . Highest education level: Not on file  Occupational History  . Not on file  Social Needs  . Financial resource strain: Not on file  . Food insecurity    Worry: Not on file    Inability: Not on file  . Transportation needs    Medical: Not on file    Non-medical: Not on file  Tobacco Use  . Smoking status: Never Smoker  . Smokeless tobacco: Never Used  Substance and Sexual Activity  . Alcohol use: Yes    Alcohol/week: 7.0 standard drinks    Types: 7 Standard drinks or equivalent per week    Comment: small glass of wine each day  . Drug use: No  . Sexual activity: Never    Birth control/protection: None  Lifestyle  . Physical activity    Days per week: Not on file    Minutes per session: Not on file  . Stress: Not on file  Relationships  . Social Herbalist on phone: Not on file    Gets together: Not on file    Attends religious service: Not on file    Active member of club or organization: Not on file    Attends meetings of clubs or organizations: Not on file     Relationship status: Not on file  . Intimate partner violence    Fear of current or ex partner: Not on file    Emotionally abused: Not on file    Physically abused: Not on file    Forced sexual activity: Not on  file  Other Topics Concern  . Not on file  Social History Narrative   Lives in [place] alone.   Retired Network engineer       Family History  Problem Relation Age of Onset  . Cancer Mother   . Stroke Father   . Heart disease Father     ROS- All systems are reviewed and negative except as per the HPI above  Physical Exam: Vitals:   10/13/18 1455  BP: (!) 92/48  Pulse: 86  Weight: 43.6 kg  Height: 5\' 3"  (1.6 m)   Wt Readings from Last 3 Encounters:  10/13/18 43.6 kg  03/18/18 50.8 kg  12/17/17 50.3 kg    Labs: Lab Results  Component Value Date   NA 139 07/21/2017   K 4.1 07/21/2017   CL 102 07/21/2017   CO2 26 07/21/2017   GLUCOSE 95 07/21/2017   BUN 18 07/21/2017   CREATININE 1.05 (H) 07/21/2017   CALCIUM 8.9 07/21/2017   Lab Results  Component Value Date   INR 0.95 10/26/2014   No results found for: CHOL, HDL, LDLCALC, TRIG   GEN- The patient is elderly appearing, alert and oriented x 3 today, HOH.   Head- normocephalic, atraumatic Eyes-  Sclera clear, conjunctiva pink Ears- hearing intact Oropharynx- clear Neck- supple, no JVP Lymph- no cervical lymphadenopathy Lungs- Clear to ausculation bilaterally, normal work of breathing Heart- irregular rate and rhythm, no murmurs, rubs or gallops, PMI not laterally displaced GI- soft, NT, ND, + BS Extremities- no clubbing, cyanosis, or  Trace edema, support hose in place MS- no significant deformity or atrophy Skin- no rash or lesion Psych- euthymic mood, full affect Neuro- strength and sensation are intact  EKG-afib at 86 bpm, qrs int 76 ms, qtc 459 ms    Assessment and Plan: 1. Permanent afib Continue 120 mg Cardizem daily Good rate control  2. Chadsvasc  score of 3 Continue Eliquis at  2.5 mg bid Denies bleeding issues  No falls  3. Hypotension Probably appropriate BP with weight loss and small stature Not symptomatic If becomes symptomatic may have to stop rate control  F/u in 6 months  Butch Penny C. Mayan Kloepfer, [facility] [address] [place], [place] [address] [phone]

## 2018-10-19 ENCOUNTER — Other Ambulatory Visit: Payer: Medicare Other

## 2018-10-23 ENCOUNTER — Other Ambulatory Visit: Payer: Self-pay

## 2018-10-23 ENCOUNTER — Ambulatory Visit: Payer: Medicare Other

## 2018-10-23 DIAGNOSIS — B351 Tinea unguium: Secondary | ICD-10-CM

## 2018-11-02 DIAGNOSIS — E441 Mild protein-calorie malnutrition: Secondary | ICD-10-CM | POA: Diagnosis not present

## 2018-11-02 DIAGNOSIS — G301 Alzheimer's disease with late onset: Secondary | ICD-10-CM | POA: Diagnosis not present

## 2018-11-02 DIAGNOSIS — I480 Paroxysmal atrial fibrillation: Secondary | ICD-10-CM | POA: Diagnosis not present

## 2018-11-02 DIAGNOSIS — S51812A Laceration without foreign body of left forearm, initial encounter: Secondary | ICD-10-CM | POA: Diagnosis not present

## 2018-11-06 ENCOUNTER — Other Ambulatory Visit: Payer: Medicare Other

## 2018-11-10 ENCOUNTER — Ambulatory Visit: Payer: Medicare Other

## 2018-11-10 ENCOUNTER — Other Ambulatory Visit: Payer: Self-pay

## 2018-11-10 DIAGNOSIS — B351 Tinea unguium: Secondary | ICD-10-CM

## 2018-11-11 NOTE — Progress Notes (Signed)
Patient is here today with complaint of bilateral thick and painful hallux nails.  She is here today with her caregiver which is her daughter.  They are currently using Penlac and applying it topically to the nails daily.  Bilateral hallux nails were filed with no iatrogenic bleeding.  They are to follow-up next week.

## 2018-11-19 NOTE — Progress Notes (Signed)
Patient is here today with complaint of bilateral thick and painful hallux nails.  She is here today with her caregiver which is her daughter.  They are currently using Penlac and applying it topically to the nails daily.  Bilateral hallux nails were filed with no iatrogenic bleeding.  They are to follow-up next week.

## 2018-11-20 DIAGNOSIS — I480 Paroxysmal atrial fibrillation: Secondary | ICD-10-CM | POA: Diagnosis not present

## 2018-11-20 DIAGNOSIS — G301 Alzheimer's disease with late onset: Secondary | ICD-10-CM | POA: Diagnosis not present

## 2018-11-20 DIAGNOSIS — F0280 Dementia in other diseases classified elsewhere without behavioral disturbance: Secondary | ICD-10-CM | POA: Diagnosis not present

## 2018-11-20 DIAGNOSIS — E441 Mild protein-calorie malnutrition: Secondary | ICD-10-CM | POA: Diagnosis not present

## 2018-11-23 DIAGNOSIS — D7589 Other specified diseases of blood and blood-forming organs: Secondary | ICD-10-CM | POA: Diagnosis not present

## 2018-11-24 ENCOUNTER — Other Ambulatory Visit: Payer: Self-pay

## 2018-11-24 ENCOUNTER — Ambulatory Visit: Payer: Medicare Other

## 2018-11-24 DIAGNOSIS — B351 Tinea unguium: Secondary | ICD-10-CM

## 2018-12-05 ENCOUNTER — Emergency Department (HOSPITAL_COMMUNITY)
Admission: EM | Admit: 2018-12-05 | Discharge: 2018-12-05 | Disposition: A | Discharge status: Home / Self Care | Payer: Medicare Other | Attending: Emergency Medicine | Admitting: Emergency Medicine

## 2018-12-05 ENCOUNTER — Encounter (HOSPITAL_COMMUNITY): Payer: Self-pay | Admitting: Emergency Medicine

## 2018-12-05 ENCOUNTER — Other Ambulatory Visit: Payer: Self-pay

## 2018-12-05 DIAGNOSIS — Z85038 Personal history of other malignant neoplasm of large intestine: Secondary | ICD-10-CM | POA: Insufficient documentation

## 2018-12-05 DIAGNOSIS — W19XXXD Unspecified fall, subsequent encounter: Secondary | ICD-10-CM | POA: Insufficient documentation

## 2018-12-05 DIAGNOSIS — S41112A Laceration without foreign body of left upper arm, initial encounter: Secondary | ICD-10-CM

## 2018-12-05 DIAGNOSIS — Y939 Activity, unspecified: Secondary | ICD-10-CM | POA: Insufficient documentation

## 2018-12-05 DIAGNOSIS — Z96641 Presence of right artificial hip joint: Secondary | ICD-10-CM | POA: Diagnosis not present

## 2018-12-05 DIAGNOSIS — S41112D Laceration without foreign body of left upper arm, subsequent encounter: Secondary | ICD-10-CM | POA: Insufficient documentation

## 2018-12-05 DIAGNOSIS — Z23 Encounter for immunization: Secondary | ICD-10-CM | POA: Diagnosis not present

## 2018-12-05 DIAGNOSIS — Y999 Unspecified external cause status: Secondary | ICD-10-CM | POA: Insufficient documentation

## 2018-12-05 DIAGNOSIS — Y92129 Unspecified place in nursing home as the place of occurrence of the external cause: Secondary | ICD-10-CM | POA: Diagnosis not present

## 2018-12-05 DIAGNOSIS — Z7901 Long term (current) use of anticoagulants: Secondary | ICD-10-CM | POA: Insufficient documentation

## 2018-12-05 DIAGNOSIS — Z79899 Other long term (current) drug therapy: Secondary | ICD-10-CM | POA: Diagnosis not present

## 2018-12-05 MED ORDER — CEPHALEXIN 500 MG PO CAPS: 500.0000 mg | ORAL_CAPSULE | Freq: Two times a day (BID) | ORAL | 0 refills | Status: DC

## 2018-12-05 MED ORDER — CEPHALEXIN 250 MG PO CAPS
500.0000 mg | ORAL_CAPSULE | Freq: Once | ORAL | Status: AC
  Administered 2018-12-05: 500 mg via ORAL
  Filled 2018-12-05: qty 2

## 2018-12-05 MED ORDER — CEPHALEXIN 500 MG PO CAPS: 500.0000 mg | ORAL_CAPSULE | Freq: Two times a day (BID) | ORAL | 0 refills | Status: AC

## 2018-12-05 MED ORDER — TETANUS-DIPHTH-ACELL PERTUSSIS 5-2.5-18.5 LF-MCG/0.5 IM SUSP
0.5000 mL | Freq: Once | INTRAMUSCULAR | Status: AC
  Administered 2018-12-05: 0.5 mL via INTRAMUSCULAR
  Filled 2018-12-05: qty 0.5

## 2018-12-05 NOTE — ED Provider Notes (Signed)
[facility] EMERGENCY DEPARTMENT Provider Note   CSN: [id number] Arrival date & time: 12/05/18  1415     History   Chief Complaint Chief Complaint  Patient presents with  . Laceration    HPI Gabriella Jackson is a 85 y.o. female.     The history is provided by the patient. No language interpreter was used.  Laceration Location:  Shoulder/arm Shoulder/arm laceration location:  L upper arm Length:  12 Depth:  Through dermis Time since incident:  5 days Laceration mechanism:  Unable to specify Pain details:    Quality:  Aching   Severity:  No pain   Progression:  Worsening Foreign body present:  No foreign bodies Relieved by:  Nothing Worsened by:  Nothing Ineffective treatments: steri strips. Tetanus status:  Out of date Associated symptoms: no fever   Pt's daughter is concerned pt may be developing an infection.  She is worried about wound care  Past Medical History:  Diagnosis Date  . Colon cancer ([place]) 2002   s/p colonic resection- chemotherapy and surgery  . Depressed   . DJD (degenerative joint disease)   . Fuchs' corneal dystrophy 2002  . Hernia, inguinal, right   . ITP (idiopathic thrombocytopenic purpura)    s/p splenectomy- remission  . Osteopenia   . Paroxysmal atrial fibrillation ([facility])    hx SVT  . PONV (postoperative nausea and vomiting)   . SVT (supraventricular tachycardia) ([place])   . Urticaria     Patient Active Problem List   Diagnosis Date Noted  . Impacted cerumen of left ear 07/21/2017  . Sensorineural hearing loss (SNHL), bilateral 07/21/2017  . S/P right THA, AA 11/01/2014  . Arthritis of right hip 02/25/2014  . SVT (supraventricular tachycardia) ([place]) 01/27/2013  . Pseudophakia 02/05/2012  . Fuchs' corneal dystrophy 04/17/2011    Past Surgical History:  Procedure Laterality Date  . ABDOMINAL HYSTERECTOMY    . COLON SURGERY    . EYE SURGERY     corneal replacements and cataracts removed.  Marland Kitchen ROTATOR CUFF  REPAIR Right   . SMALL INTESTINE SURGERY    . SPLENECTOMY  1999  . TOTAL HIP ARTHROPLASTY Right 11/01/2014   Procedure: RIGHT TOTAL HIP ARTHROPLASTY ANTERIOR APPROACH;  Surgeon: Paralee Cancel, MD;  Location: [facility];  Service: Orthopedics;  Laterality: Right;     OB History   No obstetric history on file.      Home Medications    Prior to Admission medications   Medication Sig Start Date End Date Taking? Authorizing Provider  apixaban (ELIQUIS) 2.5 MG TABS tablet Take 1 tablet (2.5 mg total) by mouth 2 (two) times daily. 02/06/18   Sherran Needs, NP  CARTIA XT 120 MG 24 hr capsule TAKE 1 CAPSULE BY MOUTH ONCE DAILY 11/14/17   Sherran Needs, NP  cephALEXin (KEFLEX) 500 MG capsule Take 1 capsule (500 mg total) by mouth 2 (two) times daily. 12/05/18   Fransico Meadow, PA-C  ciclopirox (PENLAC) 8 % solution Apply topically at bedtime. Apply over nail and surrounding skin. Apply daily over previous coat. After seven (7) days, may remove with alcohol and continue cycle. 08/10/18   Trula Slade, DPM  donepezil (ARICEPT) 5 MG tablet Take 5 mg by mouth at bedtime.    [provider]  memantine (NAMENDA) 10 MG tablet Take by mouth 2 (two) times daily.    [provider]    Family History Family History  Problem Relation Age of Onset  .  Cancer Mother   . Stroke Father   . Heart disease Father     Social History Social History   Tobacco Use  . Smoking status: Never Smoker  . Smokeless tobacco: Never Used  Substance Use Topics  . Alcohol use: Yes    Alcohol/week: 7.0 standard drinks    Types: 7 Standard drinks or equivalent per week    Comment: small glass of wine each day  . Drug use: No     Allergies   Codeine, Aspirin, Campho-phenique [phenol], Ciprofloxacin, Nsaids, and Sulfa antibiotics   Review of Systems Review of Systems  Constitutional: Negative for fever.  Skin: Positive for wound.  All other systems reviewed and are negative.    Physical  Exam Updated Vital Signs BP 94/70   Pulse (!) 109   Temp 98 F (36.7 C) (Oral)   Resp 18   SpO2 97%   Physical Exam Vitals signs reviewed.  HENT:     Head: Normocephalic.  Neck:     Musculoskeletal: Normal range of motion.  Cardiovascular:     Rate and Rhythm: Normal rate.  Pulmonary:     Effort: Pulmonary effort is normal.  Skin:    General: Skin is warm.     Comments: 12 cm laceration slight gapping,  Some granulation tissue, slight erythema   Neurological:     General: No focal deficit present.     Mental Status: She is alert.  Psychiatric:        Mood and Affect: Mood normal.      ED Treatments / Results  Labs (all labs ordered are listed, but only abnormal results are displayed) Labs Reviewed - No data to display  EKG None  Radiology No results found.  Procedures Procedures (including critical care time)  Medications Ordered in ED Medications  Tdap (BOOSTRIX) injection 0.5 mL (has no administration in time range)     Initial Impression / Assessment and Plan / ED Course  I have reviewed the triage vital signs and the nursing notes.  Pertinent labs & imaging results that were available during my care of the patient were reviewed by me and considered in my medical decision making (see chart for details).      MDM   steristrips removed, wounds cleaned. I will start pt on keflex.  I advised watch carefully for any infection   Final Clinical Impressions(s) / ED Diagnoses   Final diagnoses:  Laceration of left upper extremity with complication, initial encounter    ED Discharge Orders         Ordered    cephALEXin (KEFLEX) 500 MG capsule  2 times daily     12/05/18 1531        An After Visit Summary was printed and given to the patient.    Fransico Meadow, Hershal Coria 12/05/18 1538    Lajean Saver, MD 12/05/18 [phone]

## 2018-12-05 NOTE — ED Notes (Signed)
Variable pulse noted. [place] pa aware.

## 2018-12-05 NOTE — ED Triage Notes (Signed)
Pt arrives from [facility] with reports of a laceration to the left arm Tuesday night. Family believes she fell. Nurses at the facility have put steri strips on it. PCP sent her to evaluate for infection and wound care.

## 2018-12-05 NOTE — Discharge Instructions (Signed)
Return if any problems.

## 2018-12-05 NOTE — ED Notes (Signed)
Pt noted as laceration and skin tear at left forearm that daughter states occurred on Tuesday. Steristriops noted x 8.

## 2018-12-05 NOTE — ED Notes (Signed)
Daughter states she understands instructions and pt home stable with daughter via wc.

## 2018-12-07 DIAGNOSIS — E441 Mild protein-calorie malnutrition: Secondary | ICD-10-CM | POA: Diagnosis not present

## 2018-12-07 DIAGNOSIS — L03114 Cellulitis of left upper limb: Secondary | ICD-10-CM | POA: Diagnosis not present

## 2018-12-07 DIAGNOSIS — G301 Alzheimer's disease with late onset: Secondary | ICD-10-CM | POA: Diagnosis not present

## 2018-12-07 DIAGNOSIS — Z7189 Other specified counseling: Secondary | ICD-10-CM | POA: Diagnosis not present

## 2018-12-09 ENCOUNTER — Other Ambulatory Visit: Payer: Medicare Other

## 2018-12-11 NOTE — Progress Notes (Signed)
Patient is here today with complaint of bilateral thick and painful hallux nails.  She is here today with her caregiver which is her daughter.  They are currently using Penlac and applying it topically to the nails daily.  Bilateral hallux nails were filed with no iatrogenic bleeding.  They are to follow-up next week. 

## 2018-12-17 ENCOUNTER — Other Ambulatory Visit: Payer: Self-pay

## 2018-12-17 ENCOUNTER — Ambulatory Visit: Payer: Medicare Other

## 2018-12-17 DIAGNOSIS — B351 Tinea unguium: Secondary | ICD-10-CM

## 2018-12-22 DIAGNOSIS — G301 Alzheimer's disease with late onset: Secondary | ICD-10-CM | POA: Diagnosis not present

## 2018-12-22 DIAGNOSIS — D6869 Other thrombophilia: Secondary | ICD-10-CM | POA: Diagnosis not present

## 2018-12-22 DIAGNOSIS — F0280 Dementia in other diseases classified elsewhere without behavioral disturbance: Secondary | ICD-10-CM | POA: Diagnosis not present

## 2018-12-22 DIAGNOSIS — I480 Paroxysmal atrial fibrillation: Secondary | ICD-10-CM | POA: Diagnosis not present

## 2018-12-31 ENCOUNTER — Other Ambulatory Visit: Payer: Medicare Other

## 2019-01-01 NOTE — Progress Notes (Signed)
Patient is here today with complaint of bilateral thick and painful hallux nails.  She is here today with her caregiver which is her daughter.  They are currently using Penlac and applying it topically to the nails daily.  Bilateral hallux nails were filed with no iatrogenic bleeding.  They are to follow-up next week. 

## 2019-01-06 DIAGNOSIS — Z947 Corneal transplant status: Secondary | ICD-10-CM | POA: Diagnosis not present

## 2019-01-08 NOTE — Progress Notes (Signed)
Patient is here today with complaint of bilateral thick and painful hallux nails.  She is here today with her caregiver which is her daughter.  They are currently using Penlac and applying it topically to the nails daily.  Bilateral hallux nails were filed with no iatrogenic bleeding.  They are to follow-up next week. 

## 2019-03-03 ENCOUNTER — Telehealth: Payer: Self-pay | Admitting: Internal Medicine

## 2019-03-03 NOTE — Telephone Encounter (Signed)
[place] will follow up with daughter and plan will be to stop Eliquis due to falls.

## 2019-03-03 NOTE — Telephone Encounter (Signed)
Pt c/o medication issue:  1. Name of Medication: apixaban (ELIQUIS) 2.5 MG TABS tablet  2. How are you currently taking this medication (dosage and times per day)? 2.5 MG per day  3. Are you having a reaction (difficulty breathing--STAT)? No   4. What is your medication issue? [place] from hospice is calling in regards to patient experiencing a bad fall on 02/28/19 and is requesting she be taken off apixaban (ELIQUIS) 2.5 MG TABS tablet due to her being a high fall risk and her living by herself. Please advise.

## 2019-03-03 NOTE — Telephone Encounter (Signed)
Left message to discuss. Per Roderic Palau NP - if daughter is agreeable (was formally against stopping eliquis) to stopping Eliquis. Ok from our standpoint to stop Eliquis with increased risk of falls.

## 2019-05-10 DEATH — deceased
